# Patient Record
Sex: Female | Born: 1980 | Race: White | Hispanic: Yes | Marital: Single | Smoking: Former smoker
Health system: Southern US, Community
[De-identification: ages and names within clinical notes are randomized; demographics above are authoritative.]

## PROBLEM LIST (undated history)

## (undated) DIAGNOSIS — O039 Complete or unspecified spontaneous abortion without complication: Secondary | ICD-10-CM

## (undated) DIAGNOSIS — J45909 Unspecified asthma, uncomplicated: Secondary | ICD-10-CM

## (undated) HISTORY — PX: DILATION AND CURETTAGE OF UTERUS: SHX78

---

## 2008-02-27 ENCOUNTER — Emergency Department: Payer: Self-pay | Admitting: Emergency Medicine

## 2012-11-13 ENCOUNTER — Emergency Department: Payer: Self-pay | Admitting: Emergency Medicine

## 2013-03-05 ENCOUNTER — Emergency Department: Payer: Self-pay | Admitting: Emergency Medicine

## 2013-03-05 LAB — URINALYSIS, COMPLETE
Bilirubin,UR: NEGATIVE
Glucose,UR: NEGATIVE mg/dL (ref 0–75)
Leukocyte Esterase: NEGATIVE
Nitrite: NEGATIVE
Protein: 30
RBC,UR: 209 /HPF (ref 0–5)
Specific Gravity: 1.026 (ref 1.003–1.030)
Squamous Epithelial: 2

## 2013-03-05 LAB — CBC
HGB: 13.1 g/dL (ref 12.0–16.0)
MCH: 33.3 pg (ref 26.0–34.0)
MCHC: 36 g/dL (ref 32.0–36.0)
MCV: 93 fL (ref 80–100)
Platelet: 334 10*3/uL (ref 150–440)
RDW: 13.8 % (ref 11.5–14.5)
WBC: 7.8 10*3/uL (ref 3.6–11.0)

## 2013-03-05 LAB — HCG, QUANTITATIVE, PREGNANCY: Beta Hcg, Quant.: 119 m[IU]/mL — ABNORMAL HIGH

## 2013-03-05 LAB — WET PREP, GENITAL

## 2013-03-06 LAB — GC/CHLAMYDIA PROBE AMP

## 2013-03-09 ENCOUNTER — Ambulatory Visit: Payer: Self-pay | Admitting: Obstetrics and Gynecology

## 2013-03-09 LAB — COMPREHENSIVE METABOLIC PANEL
Albumin: 3 g/dL — ABNORMAL LOW (ref 3.4–5.0)
Anion Gap: 10 (ref 7–16)
BUN: 12 mg/dL (ref 7–18)
Bilirubin,Total: 0.5 mg/dL (ref 0.2–1.0)
Calcium, Total: 8 mg/dL — ABNORMAL LOW (ref 8.5–10.1)
Chloride: 108 mmol/L — ABNORMAL HIGH (ref 98–107)
Co2: 20 mmol/L — ABNORMAL LOW (ref 21–32)
Creatinine: 0.68 mg/dL (ref 0.60–1.30)
EGFR (African American): >60
EGFR (Non-African Amer.): >60
SGPT (ALT): 10 U/L — ABNORMAL LOW (ref 12–78)
Sodium: 138 mmol/L (ref 136–145)
Total Protein: 5.8 g/dL — ABNORMAL LOW (ref 6.4–8.2)

## 2013-03-09 LAB — CBC
HCT: 27.5 % — ABNORMAL LOW (ref 35.0–47.0)
HGB: 9.5 g/dL — ABNORMAL LOW (ref 12.0–16.0)
MCH: 32.3 pg (ref 26.0–34.0)
MCHC: 34.5 g/dL (ref 32.0–36.0)
RBC: 2.93 10*6/uL — ABNORMAL LOW (ref 3.80–5.20)
WBC: 22.9 10*3/uL — ABNORMAL HIGH (ref 3.6–11.0)

## 2013-03-09 LAB — APTT: Activated PTT: 26.5 secs (ref 23.6–35.9)

## 2013-03-10 LAB — PATHOLOGY REPORT

## 2013-03-14 LAB — CULTURE, BLOOD (SINGLE)

## 2014-03-25 DIAGNOSIS — J45909 Unspecified asthma, uncomplicated: Secondary | ICD-10-CM | POA: Insufficient documentation

## 2014-03-25 DIAGNOSIS — O99330 Smoking (tobacco) complicating pregnancy, unspecified trimester: Secondary | ICD-10-CM | POA: Insufficient documentation

## 2014-06-08 LAB — HM PAP SMEAR: HM PAP: NORMAL

## 2015-01-28 NOTE — Op Note (Signed)
PATIENT NAME:  Christine Bowers, Christine Bowers MR#:  [id number] DATE OF BIRTH:  September 01, 1981  DATE OF PROCEDURE:  03/09/2013  PREOPERATIVE DIAGNOSES:  1.  Hypotension.  2.  Acute vaginal bleeding.  3.  Possible incomplete abortion.  4.  Cannot rule out ectopic pregnancy.   POSTOPERATIVE DIAGNOSES:  1.  Hypotension.  2.  Acute vaginal bleeding.  3.  Incomplete abortion.  4.  Ectopic pregnancy ruled out.  5.  Pelvic adhesive disease.   OPERATIVE PROCEDURES:  1.  Suction D and C.  2.  Diagnostic laparoscopy.   SURGEON: Herold Harms, M.D.   FIRST ASSISTANT: None.   ANESTHESIA: General endotracheal.   INDICATIONS: The patient is a 34 year old, married, white female, gravida 3, para 2- 0-0-2, with uncertain last menstrual period, who presented through the Emergency Room with acute vaginal bleeding and hypotension; she is status post 4 L of IV fluids and 2 units of packed red blood cells with stabilization of vital signs. Quantitative hCG titer has dropped from 191 to 41; preoperative hemoglobin is 9.4 grams. Pelvic ultrasound today demonstrated fluid and clot within the pelvis. No adnexal mass was seen. There was no evidence of pregnancy within the uterine cavity. Previous ultrasounds 2 days ago demonstrated thickened endometrium 2.3 cm without evidence of adnexal mass.   FINDINGS AT SURGERY: Included an open cervical os with decidual-like tissue at the os. Suction D and C was performed with all tissue being removed from the endometrial cavity. Laparoscopy was performed and demonstrated a grossly normal pelvis with mild pelvic adhesions between the bowel, left adnexa and the left pelvic sidewall. No lysis was performed. The fallopian tubes and ovaries were grossly normal. The upper abdomen, including liver and gallbladder, were normal. There was no evidence of ectopic pregnancy within the pelvis.   DESCRIPTION OF PROCEDURE: The patient was brought to the operating room where she was placed in the supine  position and general endotracheal anesthesia was induced without difficulty. She was placed in the dorsal lithotomy position using the bumblebee stirrups. A ChloraPrep and Betadine abdominal, perineal, intravaginal prep and drape was performed in the standard fashion. A Foley catheter, which previously had been placed, was removed. The patient was placed in the dorsal lithotomy position. A weighted speculum was placed into the vagina and a single-tooth tenaculum was placed on the anterior lip of the cervix. Decidual-like tissue noted at the os was removed with ring forceps. The uterus sounded to 12 cm. A #10 suction curette was used with suction curettage to remove tissue consistent with POC. A serrated curette was also used and verified a good cry without significant residual tissue being left behind. A Hulka tenaculum was placed on the cervix. The D and C was  terminated and the patient was placed in low lithotomy position. A subumbilical vertical 5 mm incision was made. The Optiview laparoscopic trocar system was used to place a 5 mm port directly into the abdominal pelvic cavity without evidence of bowel or vascular injury. CO2 pneumoperitoneum was created. The above-noted findings were photo documented. The pelvis was irrigated with normal saline and the irrigant fluid was aspirated with a blunt tipped probe. Once satisfied with documentation of no evidence of ectopic pregnancy, the procedure was terminated with all instrumentation being removed from the abdomen. The pneumoperitoneum was released. The incisions were closed with Dermabond glue. The patient was then awakened, extubated, and taken to the recovery room in satisfactory condition.   ESTIMATED BLOOD LOSS: Minimal.   COMPLICATIONS: None.  All instruments, needle and sponge counts were verified as correct.   ____________________________ Prentice DockerMartin A. DeFrancesco, MD mad:aw D: 03/09/2013 07:03:18 ET T: 03/09/2013 09:21:03  ET JOB#: [id number]  cc: Daphine DeutscherMartin A. DeFrancesco, MD, <Dictator> [facility] Prentice DockerMARTIN A DEFRANCESCO MD ELECTRONICALLY SIGNED 03/17/2013 15:05

## 2015-02-15 NOTE — H&P (Signed)
L&D Evaluation:  History:  HPI 31 MWF [id number] ADMITTTED THROUGH ER WITH ACUTE VAGINAL BLEEDING, HYPOTENSION, AND NON VIABLE FIRST TRIMESTER PREGNANCY; QUANTITATIVE HCG DROPPING 191 TO 41; ACUTE BLEEDING OVER 48 HOURS CAUSED 4 GRAM DROP IN HGB; S/P 2 UNITS PRBC'S HGB NOW 9.4; VSS(PULSE 78, BP 100/70). U/S 2 DAYS AGO SHOWED NOTHING IN UTERUS BUT 2.3 CM HETEROGENOUS ENDOMETRIUM AND NOTHING IN ADNEXA. U/S TODAY NOTABLE FOR LARGE AMOUNT OF FLUID IN PELVIS, WITHOUT DISCRETE MASS, UTERUS STILL EMPTY. PT DID HAVE RLQ PAIN BEFORE ER ARRIVAL.   Patient's Medical History Asthma   Patient's Surgical History EAR TUVBES left ear CHILD   Medications ADVAIR, ZYRTEC, PROAIR   Allergies NSAIDS   Social History tobacco  6 CIGS/DAY   Family History Non-Contributory   ROS:  ROS PER HPI   Exam:  Vital Signs PULSE 78; BP100/70   General no apparent distress   Mental Status clear   Chest clear   Heart normal sinus rhythm   Abdomen FLAT, NONDISTENDED   Edema no edema   Pelvic DEFERRED TO OR   Skin dry   Lymph no lymphadenopathy   Impression:  Impression ACUTE VAGINAL BLEEDING WITH HYPOTENSION, S/P 2 UNIT PRBC TRANSFUSION AND 4 L NS IVF'S; CAN NOT R/O ECTOPIC, SAB; ELEVATESD WBC COUNT, S/P ZOSYN IV ANTIBIOTIC X 1 DOSE.   Plan:  Plan LAPAROSCOPY WITH REMOVAL OF POSSIBLE ECTOPIC, SUCTION D&C   Comments PRE OP COUNSELING:  PT HAS BEEN COUNSELED REGARDING PLANNED PROCEDURES. SHE IS ACCEPTING OF ALL RISKS INCLUDING BUT NOT LIMITED TOI BLEEDING/INFECTION/PELVIC ORGAN INJURY WITH NEED FOR REPAIR/ANESTHESIA RISKS/ETC. SHE UNDERSTANDS THAT FUTURE FERTILITY IS NOT GUARANTEED. ALL QUESTIONS ARE ANSWERED. CONSENT IS GIVEN.   Electronic Signatures: Raina Sole, Prentice DockerMartin A (MD)  (Signed 02-Jun-14 05:11)  Authored: L&D Evaluation   Last Updated: 02-Jun-14 05:11 by Estefana Taylor, Prentice DockerMartin A (MD)

## 2015-07-13 ENCOUNTER — Emergency Department: Payer: Medicaid Other

## 2015-07-13 ENCOUNTER — Encounter: Payer: Self-pay | Admitting: Emergency Medicine

## 2015-07-13 ENCOUNTER — Emergency Department
Admission: EM | Admit: 2015-07-13 | Discharge: 2015-07-13 | Disposition: A | Discharge status: Home / Self Care | Payer: Medicaid Other | Attending: Emergency Medicine | Admitting: Emergency Medicine

## 2015-07-13 DIAGNOSIS — Z3A01 Less than 8 weeks gestation of pregnancy: Secondary | ICD-10-CM | POA: Insufficient documentation

## 2015-07-13 DIAGNOSIS — O99331 Smoking (tobacco) complicating pregnancy, first trimester: Secondary | ICD-10-CM | POA: Insufficient documentation

## 2015-07-13 DIAGNOSIS — O039 Complete or unspecified spontaneous abortion without complication: Secondary | ICD-10-CM

## 2015-07-13 DIAGNOSIS — F17200 Nicotine dependence, unspecified, uncomplicated: Secondary | ICD-10-CM | POA: Diagnosis not present

## 2015-07-13 DIAGNOSIS — O209 Hemorrhage in early pregnancy, unspecified: Secondary | ICD-10-CM | POA: Diagnosis present

## 2015-07-13 HISTORY — DX: Unspecified asthma, uncomplicated: J45.909

## 2015-07-13 LAB — URINALYSIS COMPLETE WITH MICROSCOPIC (ARMC ONLY)
BILIRUBIN URINE: NEGATIVE
Bacteria, UA: NONE SEEN
GLUCOSE, UA: NEGATIVE mg/dL
Ketones, ur: NEGATIVE mg/dL
Leukocytes, UA: NEGATIVE
Nitrite: NEGATIVE
Protein, ur: 30 mg/dL — AB
Specific Gravity, Urine: 1.004 — ABNORMAL LOW (ref 1.005–1.030)
pH: 6 (ref 5.0–8.0)

## 2015-07-13 LAB — CBC
HCT: 37.2 % (ref 35.0–47.0)
Hemoglobin: 12.8 g/dL (ref 12.0–16.0)
MCH: 32 pg (ref 26.0–34.0)
MCHC: 34.3 g/dL (ref 32.0–36.0)
MCV: 93.2 fL (ref 80.0–100.0)
PLATELETS: 253 10*3/uL (ref 150–440)
RBC: 3.99 MIL/uL (ref 3.80–5.20)
RDW: 12.8 % (ref 11.5–14.5)
WBC: 18.7 10*3/uL — ABNORMAL HIGH (ref 3.6–11.0)

## 2015-07-13 LAB — HCG, QUANTITATIVE, PREGNANCY: hCG, Beta Chain, Quant, S: 45986 m[IU]/mL — ABNORMAL HIGH (ref <5)

## 2015-07-13 LAB — ABO/RH: ABO/RH(D): O POS

## 2015-07-13 MED ORDER — SODIUM CHLORIDE 0.9 % IV BOLUS (SEPSIS)
1000.0000 mL | Freq: Once | INTRAVENOUS | Status: AC
  Administered 2015-07-13: 1000 mL via INTRAVENOUS

## 2015-07-13 NOTE — ED Notes (Signed)
Pt started having cramping and vag bleeding at home, pt is pregnant positive pregnancy test at home 2 weeks ago

## 2015-07-13 NOTE — ED Notes (Signed)
Pt awaiting ultrasound

## 2015-07-13 NOTE — ED Notes (Signed)
ED Medic Sydnee Cabal logged POC into pathology at 0840.

## 2015-07-13 NOTE — ED Notes (Signed)
Pt ambulatory to triage, tearful; states she is pregnant but unsure how far along; c/o vag bleeding and cramping tonight; pt ambulates to restroom to void; mod amount vag bleeding noted; pt st urge to push and passes small fetus (approx 3" in length) and what appears to be placenta; pt assisted onto stretcher and taken to room 3; charge nurse notified and POC placed in formalin container and taken to room--care nurse notified

## 2015-07-13 NOTE — ED Provider Notes (Signed)
[facility] Emergency Department Provider Note  ____________________________________________  Time seen: Approximately 5:03 AM  I have reviewed the triage vital signs and the nursing notes.   HISTORY  Chief Complaint Vaginal Bleeding    HPI Christine Bowers is a 34 y.o. female who presents to the ED from home with the chief complaint of first trimester vaginal bleeding. Patient is a G6 P3 Ab2 who is unsure of her dates. Positive home pregnancy test 2 weeks ago. Patient awoke approximately 3:30am with pelvic cramping and vaginal bleeding. Patient use the restroom in the ED lobby and passed clots and small fetus. Denies feeling lightheaded, chest pain, shortness of breath, nausea, vomiting, diarrhea. States pelvic cramping and vaginal bleeding have improved since she passed POCs. Denies recent trauma.   Past Medical History  Diagnosis Date  . Asthma     There are no active problems to display for this patient.   No past surgical history on file.  No current outpatient prescriptions on file.  Allergies Aspirin and Ibuprofen  No family history on file.  Social History Social History  Substance Use Topics  . Smoking status: Current Every Day Smoker  . Smokeless tobacco: None  . Alcohol Use: Yes    Review of Systems Constitutional: No fever/chills Eyes: No visual changes. ENT: No sore throat. Cardiovascular: Denies chest pain. Respiratory: Denies shortness of breath. Gastrointestinal: Positive for pelvic pain. No nausea, no vomiting.  No diarrhea.  No constipation. Genitourinary: Positive for vaginal bleeding. Negative for dysuria. Musculoskeletal: Negative for back pain. Skin: Negative for rash. Neurological: Negative for headaches, focal weakness or numbness.  10-point ROS otherwise negative.  ____________________________________________   PHYSICAL EXAM:  VITAL SIGNS: ED Triage Vitals  Enc Vitals Group     BP 07/13/15 0445 115/70  mmHg     Pulse Rate 07/13/15 0445 78     Resp 07/13/15 0445 18     Temp 07/13/15 0445 98.1 F (36.7 C)     Temp Source 07/13/15 0445 Oral     SpO2 07/13/15 0445 97 %     Weight 07/13/15 0445 165 lb (74.844 kg)     Height 07/13/15 0445  (1.651 m)     Head Cir --      Peak Flow --      Pain Score 07/13/15 0445 2     Pain Loc --      Pain Edu? --      Excl. in GC? --     Constitutional: Alert and oriented. Tearful and in mild acute distress. Eyes: Conjunctivae are normal. PERRL. EOMI. Head: Atraumatic. Nose: No congestion/rhinnorhea. Mouth/Throat: Mucous membranes are moist.  Oropharynx non-erythematous. Neck: No stridor.   Cardiovascular: Normal rate, regular rhythm. Grossly normal heart sounds.  Good peripheral circulation. Respiratory: Normal respiratory effort.  No retractions. Lungs CTAB. Gastrointestinal: Soft and nontender. No distention. No abdominal bruits. No CVA tenderness. Musculoskeletal: No lower extremity tenderness nor edema.  No joint effusions. Neurologic:  Normal speech and language. No gross focal neurologic deficits are appreciated.  Skin:  Skin is warm, dry and intact. No rash noted. Psychiatric: Mood and affect are normal. Speech and behavior are normal.  ____________________________________________   LABS (all labs ordered are listed, but only abnormal results are displayed)  Labs Reviewed  HCG, QUANTITATIVE, PREGNANCY - Abnormal; Notable for the following:    hCG, Beta Chain, Quant, [place] [address] (*)    All other components within normal limits  CBC - Abnormal; Notable for the following:  WBC 18.7 (*)    All other components within normal limits  URINALYSIS COMPLETEWITH MICROSCOPIC ([facility] ONLY) - Abnormal; Notable for the following:    Color, Urine YELLOW (*)    APPearance CLEAR (*)    Specific Gravity, Urine 1.004 (*)    Hgb urine dipstick 3+ (*)    Protein, ur 30 (*)    Squamous Epithelial / LPF 0-5 (*)    All other components within normal  limits  ABO/RH   ____________________________________________  EKG  None ____________________________________________  RADIOLOGY  US OB Transvaginal interpreted per Dr. Cherly Hensen: No intrauterine gestational sac seen. No evidence for ectopic pregnancy. Small amount of blood noted within the endometrial canal. No evidence of retained products of conception. Per clinical correlation, the patient passed products of conception while in the ER. ____________________________________________   PROCEDURES  Procedure(s) performed:   Pelvic exam: External exam within normal limits without rash, vesicles or lesions. Speculum exam reveals mild vaginal bleeding; closed cervical os. Bimanual exam within normal limits.    Critical Care performed: No  ____________________________________________   INITIAL IMPRESSION / ASSESSMENT AND PLAN / ED COURSE  Pertinent labs & imaging results that were available during my care of the patient were reviewed by me and considered in my medical decision making (see chart for details).  34 year old female with first trimester vaginal bleeding and pelvic pain who passed products of conception in ED lobby. I visualized the specimen myself; fetus appears intact along with placenta. Will check lab work, blood type, obtain pelvic ultrasound to evaluate for retained POC's.  ----------------------------------------- 7:06 AM on 07/13/2015 -----------------------------------------  Patient resting in no acute distress. Tells me she is certain her blood type is O+. Updated her of ultrasound results. Strict return precautions given. Patient verbalizes understanding and agrees with plan of care.  __________________________________________   FINAL CLINICAL IMPRESSION(S) / ED DIAGNOSES  Final diagnoses:  Spontaneous miscarriage      Irean Hong, MD 07/13/15 [phone]

## 2015-07-13 NOTE — ED Notes (Signed)
Pt alert & oriented; vitals updated. Pt asks about discharge. She denies pain and states that she feels ready to go home.

## 2015-07-13 NOTE — Discharge Instructions (Signed)
1. Please follow-up with the specialist to recheck your pregnancy levels in one week. 2. Return to the ER for worsening symptoms, persistent vomiting, fainting or other concerns.  Miscarriage A miscarriage is the sudden loss of an unborn baby (fetus) before the 20th week of pregnancy. Most miscarriages happen in the first 3 months of pregnancy. Sometimes, it happens before a woman even knows she is pregnant. A miscarriage is also called a "spontaneous miscarriage" or "early pregnancy loss." Having a miscarriage can be an emotional experience. Talk with your caregiver about any questions you may have about miscarrying, the grieving process, and your future pregnancy plans. CAUSES   Problems with the fetal chromosomes that make it impossible for the baby to develop normally. Problems with the baby's genes or chromosomes are most often the result of errors that occur, by chance, as the embryo divides and grows. The problems are not inherited from the parents.  Infection of the cervix or uterus.   Hormone problems.   Problems with the cervix, such as having an incompetent cervix. This is when the tissue in the cervix is not strong enough to hold the pregnancy.   Problems with the uterus, such as an abnormally shaped uterus, uterine fibroids, or congenital abnormalities.   Certain medical conditions.   Smoking, drinking alcohol, or taking illegal drugs.   Trauma.  Often, the cause of a miscarriage is unknown.  SYMPTOMS   Vaginal bleeding or spotting, with or without cramps or pain.  Pain or cramping in the abdomen or lower back.  Passing fluid, tissue, or blood clots from the vagina. DIAGNOSIS  Your caregiver will perform a physical exam. You may also have an ultrasound to confirm the miscarriage. Blood or urine tests may also be ordered. TREATMENT   Sometimes, treatment is not necessary if you naturally pass all the fetal tissue that was in the uterus. If some of the fetus or  placenta remains in the body (incomplete miscarriage), tissue left behind may become infected and must be removed. Usually, a dilation and curettage (D and C) procedure is performed. During a D and C procedure, the cervix is widened (dilated) and any remaining fetal or placental tissue is gently removed from the uterus.  Antibiotic medicines are prescribed if there is an infection. Other medicines may be given to reduce the size of the uterus (contract) if there is a lot of bleeding.  If you have Rh negative blood and your baby was Rh positive, you will need a Rh immunoglobulin shot. This shot will protect any future baby from having Rh blood problems in future pregnancies. HOME CARE INSTRUCTIONS   Your caregiver may order bed rest or may allow you to continue light activity. Resume activity as directed by your caregiver.  Have someone help with home and family responsibilities during this time.   Keep track of the number of sanitary pads you use each day and how soaked (saturated) they are. Write down this information.   Do not use tampons. Do not douche or have sexual intercourse until approved by your caregiver.   Only take over-the-counter or prescription medicines for pain or discomfort as directed by your caregiver.   Do not take aspirin. Aspirin can cause bleeding.   Keep all follow-up appointments with your caregiver.   If you or your partner have problems with grieving, talk to your caregiver or seek counseling to help cope with the pregnancy loss. Allow enough time to grieve before trying to get pregnant again.  SEEK  IMMEDIATE MEDICAL CARE IF:   You have severe cramps or pain in your back or abdomen.  You have a fever.  You pass large blood clots (walnut-sized or larger) ortissue from your vagina. Save any tissue for your caregiver to inspect.   Your bleeding increases.   You have a thick, bad-smelling vaginal discharge.  You become lightheaded, weak, or you  faint.   You have chills.  MAKE SURE YOU:  Understand these instructions.  Will watch your condition.  Will get help right away if you are not doing well or get worse.   This information is not intended to replace advice given to you by your health care provider. Make sure you discuss any questions you have with your health care provider.   Document Released: 03/20/2001 Document Revised: 01/19/2013 Document Reviewed: 11/13/2011 Elsevier Interactive Patient Education [employer]

## 2015-07-13 NOTE — ED Notes (Signed)
Pt discharged home after verbalizing understanding of discharge instructions; nad noted. 

## 2015-07-13 NOTE — ED Notes (Signed)
Pt state she woke up this am with lower abd cramping and vaginal bleeding.  Pt is pregnant but unsure of gestation, states had positive pregnancy test 2 weeks ago and has irregular periods.  Pt states while in triage she passed what looks like products of conception.  States after passing poc cramping and bleeding has improved.  Pt denies any urinary symptoms or vaginal discharge.

## 2015-07-13 NOTE — ED Notes (Signed)
Pelvic done by md no specimens to lab

## 2015-07-13 NOTE — ED Notes (Signed)
Patient transported to Ultrasound 

## 2015-07-14 LAB — SURGICAL PATHOLOGY

## 2015-08-11 ENCOUNTER — Emergency Department
Admission: EM | Admit: 2015-08-11 | Discharge: 2015-08-11 | Disposition: A | Discharge status: Home / Self Care | Payer: Medicaid Other | Attending: Emergency Medicine | Admitting: Emergency Medicine

## 2015-08-11 ENCOUNTER — Encounter: Payer: Self-pay | Admitting: Emergency Medicine

## 2015-08-11 DIAGNOSIS — Z720 Tobacco use: Secondary | ICD-10-CM | POA: Diagnosis not present

## 2015-08-11 DIAGNOSIS — J4520 Mild intermittent asthma, uncomplicated: Secondary | ICD-10-CM | POA: Insufficient documentation

## 2015-08-11 DIAGNOSIS — J45909 Unspecified asthma, uncomplicated: Secondary | ICD-10-CM | POA: Diagnosis present

## 2015-08-11 MED ORDER — ALBUTEROL SULFATE HFA 108 (90 BASE) MCG/ACT IN AERS: 2.0000 | INHALATION_SPRAY | Freq: Four times a day (QID) | RESPIRATORY_TRACT | Status: DC | PRN

## 2015-08-11 MED ORDER — IPRATROPIUM-ALBUTEROL 0.5-2.5 (3) MG/3ML IN SOLN
3.0000 mL | Freq: Once | RESPIRATORY_TRACT | Status: AC
  Administered 2015-08-11: 3 mL via RESPIRATORY_TRACT
  Filled 2015-08-11: qty 3

## 2015-08-11 NOTE — ED Provider Notes (Signed)
[facility] Emergency Department Provider Note  ____________________________________________  Time seen: Approximately 8:14 AM  I have reviewed the triage vital signs and the nursing notes.   HISTORY  Chief Complaint Asthma   HPI Christine Bowers is a 34 y.o. female is here with complaint of asthma flare. Patient states it started to get worse yesterday. She has not had an inhaler due to insurance reasons for over a year. Patient states her wheezing is worse today.Patient states she also has difficulty with allergies this time a year. She has not used a nebulizer in many years.   Past Medical History  Diagnosis Date  . Asthma     There are no active problems to display for this patient.   History reviewed. No pertinent past surgical history.  Current Outpatient Rx  Name  Route  Sig  Dispense  Refill  . albuterol (PROVENTIL HFA;VENTOLIN HFA) 108 (90 BASE) MCG/ACT inhaler   Inhalation   Inhale 2 puffs into the lungs every 6 (six) hours as needed for wheezing or shortness of breath.   1 Inhaler   2     Allergies Aspirin and Ibuprofen  History reviewed. No pertinent family history.  Social History Social History  Substance Use Topics  . Smoking status: Current Every Day Smoker  . Smokeless tobacco: None  . Alcohol Use: Yes    Review of Systems Constitutional: No fever/chills Cardiovascular: Denies chest pain. Respiratory: Denies shortness of breath. Positive wheezing Gastrointestinal:   No nausea, no vomiting.  Musculoskeletal: Negative for back pain. Skin: Negative for rash. Neurological: Negative for headaches, focal weakness or numbness.  10-point ROS otherwise negative.  ____________________________________________   PHYSICAL EXAM:  VITAL SIGNS: ED Triage Vitals  Enc Vitals Group     BP 08/11/15 0803 118/51 mmHg     Pulse Rate 08/11/15 0803 84     Resp 08/11/15 0803 18     Temp 08/11/15 0803 97.6 F (36.4 C)     Temp  Source 08/11/15 0803 Oral     SpO2 08/11/15 0803 97 %     Weight 08/11/15 0803 160 lb (72.576 kg)     Height 08/11/15 0803 5\' 5"  (1.651 m)     Head Cir --      Peak Flow --      Pain Score --      Pain Loc --      Pain Edu? --      Excl. in GC? --     Constitutional: Alert and oriented. Well appearing and in no acute distress. Eyes: Conjunctivae are normal. PERRL. EOMI. Head: Atraumatic. Nose: No congestion/rhinnorhea.   EACs and TMs are clear bilaterally. Mouth/Throat: Mucous membranes are moist.  Oropharynx non-erythematous. Neck: No stridor.  Supple Hematological/Lymphatic/Immunilogical: No cervical lymphadenopathy. Cardiovascular: Normal rate, regular rhythm. Grossly normal heart sounds.  Good peripheral circulation. Respiratory: Normal respiratory effort.  No retractions. Lungs bilateral inspiratory and expiratory wheezes faintly throughout. Patient is no acute distress and speaking in full sentences. Gastrointestinal: Soft and nontender. No distention.  Musculoskeletal: No lower extremity tenderness nor edema.  No joint effusions. Neurologic:  Normal speech and language. No gross focal neurologic deficits are appreciated. No gait instability. Skin:  Skin is warm, dry and intact. No rash noted. Psychiatric: Mood and affect are normal. Speech and behavior are normal.  ____________________________________________   LABS (all labs ordered are listed, but only abnormal results are displayed)  Labs Reviewed - No data to display  PROCEDURES  Procedure(s) performed: None  Critical Care performed: No  ____________________________________________   INITIAL IMPRESSION / ASSESSMENT AND PLAN / ED COURSE  Pertinent labs & imaging results that were available during my care of the patient were reviewed by me and considered in my medical decision making (see chart for details).  Patient received DuoNeb treatment while in the emergency room and cleared completely. Patient was  given a prescription for albuterol inhaler. All patient was here she found out that her Medicare was reinstated therefore she couldn't get her inhaler. ____________________________________________   FINAL CLINICAL IMPRESSION(S) / ED DIAGNOSES  Final diagnoses:  Asthma, mild intermittent, uncomplicated      Tommi Rumps, PA-C 08/11/15 1252  Phineas Semen, MD 08/11/15 1453

## 2015-08-11 NOTE — Discharge Instructions (Signed)
Asthma, Adult Asthma is a condition of the lungs in which the airways tighten and narrow. Asthma can make it hard to breathe. Asthma cannot be cured, but medicine and lifestyle changes can help control it. Asthma may be started (triggered) by:  Animal skin flakes (dander).  Dust.  Cockroaches.  Pollen.  Mold.  Smoke.  Cleaning products.  Hair sprays or aerosol sprays.  Paint fumes or strong smells.  Cold air, weather changes, and winds.  Crying or laughing hard.  Stress.  Certain medicines or drugs.  Foods, such as dried fruit, potato chips, and sparkling grape juice.  Infections or conditions (colds, flu).  Exercise.  Certain medical conditions or diseases.  Exercise or tiring activities. HOME CARE   Take medicine as told by your doctor.  Use a peak flow meter as told by your doctor. A peak flow meter is a tool that measures how well the lungs are working.  Record and keep track of the peak flow meter's readings.  Understand and use the asthma action plan. An asthma action plan is a written plan for taking care of your asthma and treating your attacks.  To help prevent asthma attacks:  Do not smoke. Stay away from secondhand smoke.  Change your heating and air conditioning filter often.  Limit your use of fireplaces and wood stoves.  Get rid of pests (such as roaches and mice) and their droppings.  Throw away plants if you see mold on them.  Clean your floors. Dust regularly. Use cleaning products that do not smell.  Have someone vacuum when you are not home. Use a vacuum cleaner with a HEPA filter if possible.  Replace carpet with wood, tile, or vinyl flooring. Carpet can trap animal skin flakes and dust.  Use allergy-proof pillows, mattress covers, and box spring covers.  Wash bed sheets and blankets every week in hot water and dry them in a dryer.  Use blankets that are made of polyester or cotton.  Clean bathrooms and kitchens with bleach.  If possible, have someone repaint the walls in these rooms with mold-resistant paint. Keep out of the rooms that are being cleaned and painted.  Wash hands often. GET HELP IF:  You have make a whistling sound when breaking (wheeze), have shortness of breath, or have a cough even if taking medicine to prevent attacks.  The colored mucus you cough up (sputum) is thicker than usual.  The colored mucus you cough up changes from clear or white to yellow, green, gray, or bloody.  You have problems from the medicine you are taking such as:  A rash.  Itching.  Swelling.  Trouble breathing.  You need reliever medicines more than 2-3 times a week.  Your peak flow measurement is still at 50-79% of your personal best after following the action plan for 1 hour.  You have a fever. GET HELP RIGHT AWAY IF:   You seem to be worse and are not responding to medicine during an asthma attack.  You are short of breath even at rest.  You get short of breath when doing very little activity.  You have trouble eating, drinking, or talking.  You have chest pain.  You have a fast heartbeat.  Your lips or fingernails start to turn blue.  You are light-headed, dizzy, or faint.  Your peak flow is less than 50% of your personal best.   This information is not intended to replace advice given to you by your health care provider. Make sure  you discuss any questions you have with your health care provider.   Document Released: 03/12/2008 Document Revised: 06/15/2015 Document Reviewed: 04/23/2013 Elsevier Interactive Patient Education [employer]2016 [employer]   Follow-up with your doctor if any continued problems. A prescription for albuterol was given daily to use as needed for asthma. Obtain any over-the-counter medication for congestion and should she continue to have nasal congestion or cold symptoms.

## 2015-08-11 NOTE — ED Notes (Signed)
Pt c/o asthma flare.  Started yesterday but worse today.  Rescue inhaler is out and pt does not have insurance so has not been able to use medication. Wheezing heard with ausculation. No respiratory distress in triage.

## 2016-05-02 ENCOUNTER — Inpatient Hospital Stay
Admission: EM | Admit: 2016-05-02 | Discharge: 2016-05-05 | DRG: 770 | Disposition: A | Discharge status: Home / Self Care | Payer: BLUE CROSS/BLUE SHIELD | Attending: Obstetrics and Gynecology | Admitting: Obstetrics and Gynecology

## 2016-05-02 ENCOUNTER — Emergency Department: Payer: BLUE CROSS/BLUE SHIELD

## 2016-05-02 ENCOUNTER — Encounter: Payer: Self-pay | Admitting: Emergency Medicine

## 2016-05-02 DIAGNOSIS — O209 Hemorrhage in early pregnancy, unspecified: Secondary | ICD-10-CM

## 2016-05-02 DIAGNOSIS — O468X1 Other antepartum hemorrhage, first trimester: Secondary | ICD-10-CM

## 2016-05-02 DIAGNOSIS — N719 Inflammatory disease of uterus, unspecified: Secondary | ICD-10-CM | POA: Diagnosis present

## 2016-05-02 DIAGNOSIS — O0333 Metabolic disorder following incomplete spontaneous abortion: Secondary | ICD-10-CM | POA: Diagnosis present

## 2016-05-02 DIAGNOSIS — D649 Anemia, unspecified: Secondary | ICD-10-CM | POA: Diagnosis present

## 2016-05-02 DIAGNOSIS — B9689 Other specified bacterial agents as the cause of diseases classified elsewhere: Secondary | ICD-10-CM

## 2016-05-02 DIAGNOSIS — O3680X0 Pregnancy with inconclusive fetal viability, not applicable or unspecified: Secondary | ICD-10-CM

## 2016-05-02 DIAGNOSIS — A4151 Sepsis due to Escherichia coli [E. coli]: Secondary | ICD-10-CM | POA: Diagnosis present

## 2016-05-02 DIAGNOSIS — O2341 Unspecified infection of urinary tract in pregnancy, first trimester: Secondary | ICD-10-CM

## 2016-05-02 DIAGNOSIS — F17200 Nicotine dependence, unspecified, uncomplicated: Secondary | ICD-10-CM | POA: Diagnosis present

## 2016-05-02 DIAGNOSIS — D696 Thrombocytopenia, unspecified: Secondary | ICD-10-CM | POA: Diagnosis present

## 2016-05-02 DIAGNOSIS — E876 Hypokalemia: Secondary | ICD-10-CM | POA: Diagnosis present

## 2016-05-02 DIAGNOSIS — O0337 Sepsis following incomplete spontaneous abortion: Principal | ICD-10-CM | POA: Diagnosis present

## 2016-05-02 DIAGNOSIS — J45909 Unspecified asthma, uncomplicated: Secondary | ICD-10-CM | POA: Diagnosis present

## 2016-05-02 DIAGNOSIS — O418X10 Other specified disorders of amniotic fluid and membranes, first trimester, not applicable or unspecified: Secondary | ICD-10-CM

## 2016-05-02 DIAGNOSIS — N760 Acute vaginitis: Secondary | ICD-10-CM

## 2016-05-02 HISTORY — DX: Complete or unspecified spontaneous abortion without complication: O03.9

## 2016-05-02 LAB — URINALYSIS COMPLETE WITH MICROSCOPIC (ARMC ONLY)
BILIRUBIN URINE: NEGATIVE
Glucose, UA: NEGATIVE mg/dL
NITRITE: NEGATIVE
PH: 5 (ref 5.0–8.0)
Protein, ur: 100 mg/dL — AB
SPECIFIC GRAVITY, URINE: 1.029 (ref 1.005–1.030)

## 2016-05-02 LAB — BASIC METABOLIC PANEL
ANION GAP: 11 (ref 5–15)
BUN: 11 mg/dL (ref 6–20)
CALCIUM: 9.3 mg/dL (ref 8.9–10.3)
CO2: 21 mmol/L — AB (ref 22–32)
CREATININE: 0.59 mg/dL (ref 0.44–1.00)
Chloride: 104 mmol/L (ref 101–111)
Glucose, Bld: 108 mg/dL — ABNORMAL HIGH (ref 65–99)
Potassium: 2.8 mmol/L — ABNORMAL LOW (ref 3.5–5.1)
SODIUM: 136 mmol/L (ref 135–145)

## 2016-05-02 LAB — CBC
HCT: 41.9 % (ref 35.0–47.0)
HEMOGLOBIN: 14.5 g/dL (ref 12.0–16.0)
MCH: 32.3 pg (ref 26.0–34.0)
MCHC: 34.6 g/dL (ref 32.0–36.0)
MCV: 93.4 fL (ref 80.0–100.0)
PLATELETS: 175 10*3/uL (ref 150–440)
RBC: 4.49 MIL/uL (ref 3.80–5.20)
RDW: 13.8 % (ref 11.5–14.5)
WBC: 12.9 10*3/uL — AB (ref 3.6–11.0)

## 2016-05-02 LAB — CHLAMYDIA/NGC RT PCR (ARMC ONLY)
Chlamydia Tr: NOT DETECTED
N gonorrhoeae: NOT DETECTED

## 2016-05-02 LAB — WET PREP, GENITAL
SPERM: NONE SEEN
TRICH WET PREP: NONE SEEN
Yeast Wet Prep HPF POC: NONE SEEN

## 2016-05-02 LAB — ABO/RH: ABO/RH(D): O POS

## 2016-05-02 LAB — POCT PREGNANCY, URINE: Preg Test, Ur: POSITIVE — AB

## 2016-05-02 LAB — GLUCOSE, CAPILLARY: GLUCOSE-CAPILLARY: 86 mg/dL (ref 65–99)

## 2016-05-02 LAB — HCG, QUANTITATIVE, PREGNANCY: hCG, Beta Chain, Quant, S: 7179 m[IU]/mL — ABNORMAL HIGH (ref <5)

## 2016-05-02 MED ORDER — POTASSIUM CHLORIDE CRYS ER 20 MEQ PO TBCR
40.0000 meq | EXTENDED_RELEASE_TABLET | Freq: Once | ORAL | Status: AC
  Administered 2016-05-03: 40 meq via ORAL
  Filled 2016-05-02: qty 2

## 2016-05-02 MED ORDER — SODIUM CHLORIDE 0.9 % IV BOLUS (SEPSIS)
1000.0000 mL | Freq: Once | INTRAVENOUS | Status: AC
  Administered 2016-05-02: 1000 mL via INTRAVENOUS

## 2016-05-02 MED ORDER — CEPHALEXIN 500 MG PO CAPS
500.0000 mg | ORAL_CAPSULE | Freq: Once | ORAL | Status: AC
  Administered 2016-05-03: 500 mg via ORAL
  Filled 2016-05-02: qty 1

## 2016-05-02 MED ORDER — ONDANSETRON HCL 4 MG PO TABS: ORAL_TABLET | ORAL | 0 refills | Status: DC

## 2016-05-02 MED ORDER — METRONIDAZOLE 500 MG PO TABS
500.0000 mg | ORAL_TABLET | Freq: Once | ORAL | Status: AC
  Administered 2016-05-03: 500 mg via ORAL
  Filled 2016-05-02: qty 1

## 2016-05-02 MED ORDER — METRONIDAZOLE 500 MG PO TABS: 500.0000 mg | ORAL_TABLET | Freq: Two times a day (BID) | ORAL | 0 refills | Status: DC

## 2016-05-02 MED ORDER — CEPHALEXIN 500 MG PO CAPS: 500.0000 mg | ORAL_CAPSULE | Freq: Three times a day (TID) | ORAL | 0 refills | Status: DC

## 2016-05-02 MED ORDER — POTASSIUM CHLORIDE CRYS ER 20 MEQ PO TBCR: 20.0000 meq | EXTENDED_RELEASE_TABLET | Freq: Every day | ORAL | 0 refills | Status: DC

## 2016-05-02 NOTE — ED Notes (Signed)
Pt ambulatory to restroom with Allayne Stack, EDT assistance.

## 2016-05-02 NOTE — ED Provider Notes (Addendum)
[facility] Emergency Department Provider Note  ____________________________________________   First MD Initiated Contact with Patient 05/02/16 1935     (approximate)  I have reviewed the triage vital signs and the nursing notes.   HISTORY  Chief Complaint Vaginal Bleeding    HPI Christine Bowers is a 35 y.o. female G5 P3 Ab1 with a new diagnosis of pregnancy here in the emergency department who presents for evaluation of abdominal pain and vaginal bleeding.  She thought she might be pregnant because she has missed her period for the last 2 months.  Not taken home pregnancy test.  She reports that yesterday she developed acute onset of mild to severe lower abdominal cramping and occasionally sharp pain accompanied with nausea but no vomiting.  She reports that the pain is actually better now than it was before but that it can become severe at times.  She started having intermittent vaginal spotting that has increased somewhat in the amount of blood such that she has changed 4-5 pads since yesterday.  When she was triaged she was found heart rate in the 140s.  I identified her in the waiting room and brought her back to the exam room and at that point she already had a peripheral IV and was already receiving a fluid bolus.  She denies fever/chills, chest pain, shortness of breath, dysuria.   Past Medical History:  Diagnosis Date  . Asthma   . Miscarriage     There are no active problems to display for this patient.   Past Surgical History:  Procedure Laterality Date  . DILATION AND CURETTAGE OF UTERUS      Prior to Admission medications   Medication Sig Start Date End Date Taking? Authorizing Provider  albuterol (PROVENTIL HFA;VENTOLIN HFA) 108 (90 BASE) MCG/ACT inhaler Inhale 2 puffs into the lungs every 6 (six) hours as needed for wheezing or shortness of breath. 08/11/15   Tommi Rumps, PA-C  cephALEXin (KEFLEX) 500 MG capsule Take 1 capsule (500  mg total) by mouth 3 (three) times daily. 05/02/16   Loleta Rose, MD  metroNIDAZOLE (FLAGYL) 500 MG tablet Take 1 tablet (500 mg total) by mouth 2 (two) times daily. 05/02/16   Loleta Rose, MD  ondansetron [facility]) 4 MG tablet Take 1-2 tabs by mouth every 8 hours as needed for nausea/vomiting 05/02/16   Loleta Rose, MD  potassium chloride SA (KLOR-CON M20) 20 MEQ tablet Take 1 tablet (20 mEq total) by mouth daily. 05/02/16   Loleta Rose, MD    Allergies Aspirin and Ibuprofen  No family history on file.  Social History Social History  Substance Use Topics  . Smoking status: Current Every Day Smoker  . Smokeless tobacco: Not on file  . Alcohol use Yes    Review of Systems Constitutional: No fever/chills Eyes: No visual changes. ENT: No sore throat. Cardiovascular: Denies chest pain. Respiratory: Denies shortness of breath. Gastrointestinal: + Lower abdominal pain with nausea but no vomiting Genitourinary: Vaginal bleeding since yesterday , missed period for 2 months.  Negative for dysuria. Musculoskeletal: Negative for back pain. Skin: Negative for rash. Neurological: Negative for headaches, focal weakness or numbness.  10-point ROS otherwise negative.  ____________________________________________   PHYSICAL EXAM:  VITAL SIGNS: ED Triage Vitals  Enc Vitals Group     BP 05/02/16 1852 108/68     Pulse Rate 05/02/16 1852 (!) 142     Resp 05/02/16 1852 18     Temp 05/02/16 1852 98.7 F (37.1 C)  Temp Source 05/02/16 1852 Oral     SpO2 05/02/16 1852 98 %     Weight 05/02/16 1852 160 lb (72.6 kg)     Height 05/02/16 1852 5\' 5"  (1.651 m)     Head Circumference --      Peak Flow --      Pain Score 05/02/16 1946 4     Pain Loc --      Pain Edu? --      Excl. in GC? --     Constitutional: Alert and oriented. Well appearing and in no acute distress. Eyes: Conjunctivae are normal. PERRL. EOMI. Head: Atraumatic. Nose: No congestion/rhinnorhea. Mouth/Throat: Mucous  membranes are moist.  Oropharynx non-erythematous. Neck: No stridor.  No meningeal signs.   Cardiovascular: Normal rate, regular rhythm. Good peripheral circulation. Grossly normal heart sounds.   Respiratory: Normal respiratory effort.  No retractions. Lungs CTAB. Gastrointestinal: Soft With moderate diffuse abdominal tenderness throughout.  No distention. Genitourinary: Normal external exam.  Copious amount of yellowish gray discharge in the vaginal vault.  Cervix is very difficult to evaluate as it is significantly retroverted.  I am not able to specifically visualize the cervical os but there is no blood in the vault at this time.  On bimanual exam the cervix feels closed and there is no cervical motion tenderness nor adnexal tenderness. Musculoskeletal: No lower extremity tenderness nor edema. No gross deformities of extremities. Neurologic:  Normal speech and language. No gross focal neurologic deficits are appreciated.  Skin:  Skin is warm, dry and intact. No rash noted. Psychiatric: Mood and affect are normal. Speech and behavior are normal.  ____________________________________________   LABS (all labs ordered are listed, but only abnormal results are displayed)  Labs Reviewed  WET PREP, GENITAL - Abnormal; Notable for the following:       Result Value   Clue Cells Wet Prep HPF POC PRESENT (*)    WBC, Wet Prep HPF POC MANY (*)    All other components within normal limits  BASIC METABOLIC PANEL - Abnormal; Notable for the following:    Potassium 2.8 (*)    CO2 21 (*)    Glucose, Bld 108 (*)    All other components within normal limits  CBC - Abnormal; Notable for the following:    WBC 12.9 (*)    All other components within normal limits  URINALYSIS COMPLETEWITH MICROSCOPIC ([facility] ONLY) - Abnormal; Notable for the following:    Color, Urine AMBER (*)    APPearance CLOUDY (*)    Ketones, ur 2+ (*)    Hgb urine dipstick 2+ (*)    Protein, ur 100 (*)    Leukocytes, UA 3+ (*)     Bacteria, UA RARE (*)    Squamous Epithelial / LPF 6-30 (*)    All other components within normal limits  HCG, QUANTITATIVE, PREGNANCY - Abnormal; Notable for the following:    hCG, Beta Chain, Quant, S 7,179 (*)    All other components within normal limits  POCT PREGNANCY, URINE - Abnormal; Notable for the following:    Preg Test, Ur POSITIVE (*)    All other components within normal limits  CHLAMYDIA/NGC RT PCR ([facility] ONLY)  URINE CULTURE  GLUCOSE, CAPILLARY  CBG MONITORING, ED  POC URINE PREG, ED  ABO/RH   ____________________________________________  EKG  ED ECG REPORT I, Liliani Bobo, the attending physician, personally viewed and interpreted this ECG.  Date: 05/02/2016 EKG Time: 18:52 Rate: 135 Rhythm: Sinus tachycardia QRS Axis: normal Intervals:  normal ST/T Wave abnormalities: normal Conduction Disturbances: none Narrative Interpretation: unremarkable  ____________________________________________  RADIOLOGY   US Ob Comp Less 14 Wks  Result Date: 05/02/2016 CLINICAL DATA:  35 year old female with positive HCG levels presenting with vaginal bleeding. EXAM: OBSTETRIC <14 WK [place] AND TRANSVAGINAL OB US TECHNIQUE: Both transabdominal and transvaginal ultrasound examinations were performed for complete evaluation of the gestation as well as the maternal uterus, adnexal regions, and pelvic cul-de-sac. Transvaginal technique was performed to assess early pregnancy. COMPARISON:  None for this pregnancy FINDINGS: The uterus is heterogeneous. There is a cystic structure within the endometrial canal. No yolk sac or fetal pole identified within this cystic structure. This cystic structure may represent an early gestational sac, or a blighted ovum. Pseudo gestation of an ectopic pregnancy is not excluded. Correlation with clinical exam and follow-up with serial HCG levels and ultrasound recommended. If this cystic structure is a true gestational sac the estimated  gestational age based on mean sac diameter of 12 mm is 6 weeks, 0 days. There is a small subchorionic hemorrhage measuring 1.1 x 0.7 cm. The maternal ovaries appear unremarkable. No significant free fluid within pelvis. IMPRESSION: Intrauterine cystic structure as described may represent an early gestational sac or a blighted ovum. Ectopic pregnancy is not excluded. Correlation with clinical exam and follow-up with serial HCG levels and ultrasound recommended. Small subchorionic hemorrhage.  Follow-up recommended. Electronically Signed   By: Elgie Collard M.D.   On: 05/02/2016 22:30  US Ob Transvaginal  Result Date: 05/02/2016 CLINICAL DATA:  35 year old female with positive HCG levels presenting with vaginal bleeding. EXAM: OBSTETRIC <14 WK [place] AND TRANSVAGINAL OB US TECHNIQUE: Both transabdominal and transvaginal ultrasound examinations were performed for complete evaluation of the gestation as well as the maternal uterus, adnexal regions, and pelvic cul-de-sac. Transvaginal technique was performed to assess early pregnancy. COMPARISON:  None for this pregnancy FINDINGS: The uterus is heterogeneous. There is a cystic structure within the endometrial canal. No yolk sac or fetal pole identified within this cystic structure. This cystic structure may represent an early gestational sac, or a blighted ovum. Pseudo gestation of an ectopic pregnancy is not excluded. Correlation with clinical exam and follow-up with serial HCG levels and ultrasound recommended. If this cystic structure is a true gestational sac the estimated gestational age based on mean sac diameter of 12 mm is 6 weeks, 0 days. There is a small subchorionic hemorrhage measuring 1.1 x 0.7 cm. The maternal ovaries appear unremarkable. No significant free fluid within pelvis. IMPRESSION: Intrauterine cystic structure as described may represent an early gestational sac or a blighted ovum. Ectopic pregnancy is not excluded. Correlation with clinical  exam and follow-up with serial HCG levels and ultrasound recommended. Small subchorionic hemorrhage.  Follow-up recommended. Electronically Signed   By: Elgie Collard M.D.   On: 05/02/2016 22:30   ____________________________________________   PROCEDURES  Procedure(s) performed:   Procedures   ____________________________________________   INITIAL IMPRESSION / ASSESSMENT AND PLAN / ED COURSE  Pertinent labs & imaging results that were available during my care of the patient were reviewed by me and considered in my medical decision making (see chart for details).  I was concerned about the patient's vital signs but her heart rate is improving somewhat to the 120s with about a third of a liter of fluids.  She is well-appearing in spite of her symptoms and her tachycardia and I plan on giving a full 2 L of fluid at this point.  Obviously I am concerned  about ectopic pregnancy, threatened/inevitable abortion, etc.  As soon as her hCG has resulted we will obtain an ultrasound and I will plan for a pelvic exam as well.    Clinical Course  Value Comment By Time  HCG, Beta Chain, Quant, S: (!) 7,179 HCG has resulted, I will now order an ultrasound for early pregnancy vaginal bleeding.  Pelvic exam was unremarkable except for copious yellowish white discharge.  Patient is hemodynamically stable although she remains tachycardic in the 110s.  Rh+, no indication for RhoGAM. Loleta Rose, MD 07/26 2053  Clue Cells Wet Prep HPF POC: (!) PRESENT +BV, will prescribe Flagyl if patient able to be discharged. Loleta Rose, MD 07/26 2210  US OB Comp Less 14 Wks I discussed the abnormal findings with the patient and I have placed a page to Dr. Dalbert Garnet with GYN to discuss in terms of current management and setting up follow-up. Loleta Rose, MD 07/26 2242   As I was waiting to hear back from Dr. Dalbert Garnet I became more concerned about the constellation of symptoms and diagnoses including hypokalemia,  persistent abdominal tenderness to palpation, questionable ectopic, the initial tachycardia in the 140s, mild leukocytosis, UTI during pregnancy, bacterial vaginosis, etc.  After 2 L of fluid the patient remains slightly tachycardic at a rate of around 100.  Under the circumstances I would appreciate evaluation of the patient in the emergency department.  I called and spoke with the certified nurse midwife of Dr. Dalbert Garnet because Dr. Dalbert Garnet is in the operating room at this time.  I explained the clinical scenario and my concerns.  This is Dr. Dalbert Garnet is available she will follow up with the ED physician and come to the emergency department to evaluate the patient in person.  I am transferring ED care to Dr. Manson Passey at this time.  I updated the patient and her husband about the plan and they understand and agree. Loleta Rose, MD 07/26 2320   11:43 PM:  After I transferred care to Dr. Manson Passey and the patient was moved to the B side, she started taking about wanting to leave without OB/GYN evaluation.  I had another discussion with her.  I pointed out that she is again tachy in the 115-120 range at rest, she is shuddering (feeling cold) now, and she still has moderate tenderness to palpation of her abdomen.  I explained she is risking her health if she leaves AMA cautioned strongly against it.  Her husband agrees and promised me she will stay for OB/GYN evaluation.  I explained that the specialist may send her home, but she needs evaluation in person first.  I am giving her another liter of warmed saline due to the tachycardia and chills, and we are awaiting callback from Dr. Dalbert Garnet when she finishes in the OR.  Updated Dr. Manson Passey in person. ____________________________________________  FINAL CLINICAL IMPRESSION(S) / ED DIAGNOSES  Final diagnoses:  BV (bacterial vaginosis)  Vaginal bleeding in pregnancy, first trimester  Subchorionic hemorrhage, first trimester  UTI (urinary tract infection) during pregnancy,  first trimester  Hypokalemia  Abdominal pain   MEDICATIONS GIVEN DURING THIS VISIT:  Medications  potassium chloride SA (K-DUR,KLOR-CON) CR tablet 40 mEq (not administered)  cephALEXin (KEFLEX) capsule 500 mg (not administered)  metroNIDAZOLE (FLAGYL) tablet 500 mg (not administered)  sodium chloride 0.9 % bolus 1,000 mL (not administered)  sodium chloride 0.9 % bolus 1,000 mL (1,000 mLs Intravenous New Bag/Given 05/02/16 2017)     NEW OUTPATIENT MEDICATIONS STARTED DURING THIS VISIT:  New Prescriptions   CEPHALEXIN (KEFLEX) 500 MG CAPSULE    Take 1 capsule (500 mg total) by mouth 3 (three) times daily.   METRONIDAZOLE (FLAGYL) 500 MG TABLET    Take 1 tablet (500 mg total) by mouth 2 (two) times daily.   ONDANSETRON (ZOFRAN) 4 MG TABLET    Take 1-2 tabs by mouth every 8 hours as needed for nausea/vomiting   POTASSIUM CHLORIDE SA (KLOR-CON M20) 20 MEQ TABLET    Take 1 tablet (20 mEq total) by mouth daily.      Note:  This document was prepared using Dragon voice recognition software and may include unintentional dictation errors.    Loleta Rose, MD 05/02/16 1610    Loleta Rose, MD 05/02/16 [phone]

## 2016-05-02 NOTE — ED Triage Notes (Signed)
Pt presents to ED with reports of vaginal bleeding and possible miscarriage. Pt states she missed one period but has not taken a pregnancy test yet. Pt reports she started having bleeding yesterday and she has passed some dime sized clots. Pt reports shaking and chills. Pt tachycardic in triage. Pt reports nausea but denies vomiting and diarrhea.

## 2016-05-02 NOTE — ED Notes (Signed)
Pt returned from U/S via stretcher. 

## 2016-05-02 NOTE — ED Notes (Addendum)
Pt states vaginal bleeding x 2 days. States passing some clots off and on. Cramping off and on as well. States some nausea and fever but denies vomiting or diarrhea. States LMP about 2 months ago. States has saturated 4-5 pads since symptoms began yesterday.

## 2016-05-03 DIAGNOSIS — N719 Inflammatory disease of uterus, unspecified: Secondary | ICD-10-CM | POA: Diagnosis present

## 2016-05-03 LAB — BASIC METABOLIC PANEL
ANION GAP: 8 (ref 5–15)
BUN: 7 mg/dL (ref 6–20)
CALCIUM: 7.6 mg/dL — AB (ref 8.9–10.3)
CO2: 18 mmol/L — AB (ref 22–32)
CREATININE: 0.48 mg/dL (ref 0.44–1.00)
Chloride: 109 mmol/L (ref 101–111)
GLUCOSE: 94 mg/dL (ref 65–99)
Potassium: 3.1 mmol/L — ABNORMAL LOW (ref 3.5–5.1)
Sodium: 135 mmol/L (ref 135–145)

## 2016-05-03 LAB — CBC WITH DIFFERENTIAL/PLATELET
BASOS ABS: 0 10*3/uL (ref 0–0.1)
BASOS PCT: 0 %
EOS ABS: 0 10*3/uL (ref 0–0.7)
Eosinophils Relative: 0 %
HCT: 33.6 % — ABNORMAL LOW (ref 35.0–47.0)
Hemoglobin: 11.9 g/dL — ABNORMAL LOW (ref 12.0–16.0)
Lymphocytes Relative: 7 %
Lymphs Abs: 0.5 10*3/uL — ABNORMAL LOW (ref 1.0–3.6)
MCH: 33 pg (ref 26.0–34.0)
MCHC: 35.6 g/dL (ref 32.0–36.0)
MCV: 92.9 fL (ref 80.0–100.0)
MONO ABS: 0.5 10*3/uL (ref 0.2–0.9)
MONOS PCT: 7 %
NEUTROS ABS: 5.9 10*3/uL (ref 1.4–6.5)
Neutrophils Relative %: 86 %
PLATELETS: 127 10*3/uL — AB (ref 150–440)
RBC: 3.62 MIL/uL — ABNORMAL LOW (ref 3.80–5.20)
RDW: 13.5 % (ref 11.5–14.5)
WBC: 6.9 10*3/uL (ref 3.6–11.0)

## 2016-05-03 MED ORDER — POTASSIUM CHLORIDE CRYS ER 20 MEQ PO TBCR
40.0000 meq | EXTENDED_RELEASE_TABLET | Freq: Two times a day (BID) | ORAL | Status: DC
  Administered 2016-05-03 – 2016-05-05 (×4): 40 meq via ORAL
  Filled 2016-05-03 (×4): qty 2

## 2016-05-03 MED ORDER — MAGNESIUM HYDROXIDE 400 MG/5ML PO SUSP: 30.0000 mL | Freq: Every day | ORAL | Status: DC | PRN

## 2016-05-03 MED ORDER — CLINDAMYCIN PHOSPHATE 900 MG/50ML IV SOLN
900.0000 mg | Freq: Three times a day (TID) | INTRAVENOUS | Status: DC
Start: 2016-05-03 — End: 2016-05-04
  Administered 2016-05-03 – 2016-05-04 (×5): 900 mg via INTRAVENOUS
  Filled 2016-05-03 (×6): qty 50

## 2016-05-03 MED ORDER — ONDANSETRON HCL 4 MG PO TABS: 4.0000 mg | ORAL_TABLET | Freq: Four times a day (QID) | ORAL | Status: DC | PRN

## 2016-05-03 MED ORDER — MONTELUKAST SODIUM 10 MG PO TABS
10.0000 mg | ORAL_TABLET | Freq: Every day | ORAL | Status: DC
  Administered 2016-05-03 – 2016-05-04 (×2): 10 mg via ORAL
  Filled 2016-05-03 (×3): qty 1

## 2016-05-03 MED ORDER — ALBUTEROL SULFATE (2.5 MG/3ML) 0.083% IN NEBU: 3.0000 mL | INHALATION_SOLUTION | Freq: Four times a day (QID) | RESPIRATORY_TRACT | Status: DC | PRN

## 2016-05-03 MED ORDER — BISACODYL 5 MG PO TBEC
5.0000 mg | DELAYED_RELEASE_TABLET | Freq: Every day | ORAL | Status: DC | PRN
  Filled 2016-05-03: qty 1

## 2016-05-03 MED ORDER — OXYCODONE-ACETAMINOPHEN 5-325 MG PO TABS
1.0000 | ORAL_TABLET | Freq: Three times a day (TID) | ORAL | Status: DC | PRN
  Administered 2016-05-03: 1 via ORAL
  Filled 2016-05-03: qty 1

## 2016-05-03 MED ORDER — METRONIDAZOLE 500 MG PO TABS
500.0000 mg | ORAL_TABLET | Freq: Two times a day (BID) | ORAL | Status: DC
  Administered 2016-05-03 – 2016-05-05 (×5): 500 mg via ORAL
  Filled 2016-05-03 (×5): qty 1

## 2016-05-03 MED ORDER — DOCUSATE SODIUM 100 MG PO CAPS
100.0000 mg | ORAL_CAPSULE | Freq: Two times a day (BID) | ORAL | Status: DC
  Administered 2016-05-03: 100 mg via ORAL
  Filled 2016-05-03 (×5): qty 1

## 2016-05-03 MED ORDER — TEMAZEPAM 15 MG PO CAPS: 15.0000 mg | ORAL_CAPSULE | Freq: Every evening | ORAL | Status: DC | PRN

## 2016-05-03 MED ORDER — GENTAMICIN SULFATE 40 MG/ML IJ SOLN
5.0000 mg/kg | INTRAVENOUS | Status: DC
  Administered 2016-05-03 – 2016-05-04 (×2): 360 mg via INTRAVENOUS
  Filled 2016-05-03 (×3): qty 9

## 2016-05-03 MED ORDER — MOMETASONE FURO-FORMOTEROL FUM 100-5 MCG/ACT IN AERO
2.0000 | INHALATION_SPRAY | Freq: Two times a day (BID) | RESPIRATORY_TRACT | Status: DC
  Administered 2016-05-03 – 2016-05-05 (×5): 2 via RESPIRATORY_TRACT
  Filled 2016-05-03: qty 8.8

## 2016-05-03 MED ORDER — LACTATED RINGERS IV SOLN
INTRAVENOUS | Status: DC
  Administered 2016-05-03 – 2016-05-04 (×4): via INTRAVENOUS

## 2016-05-03 MED ORDER — ALUM & MAG HYDROXIDE-SIMETH 200-200-20 MG/5ML PO SUSP: 30.0000 mL | ORAL | Status: DC | PRN

## 2016-05-03 MED ORDER — ACETAMINOPHEN 325 MG PO TABS
650.0000 mg | ORAL_TABLET | Freq: Once | ORAL | Status: AC
  Administered 2016-05-03: 650 mg via ORAL
  Filled 2016-05-03: qty 2

## 2016-05-03 MED ORDER — MAGNESIUM CITRATE PO SOLN: 1.0000 | Freq: Once | ORAL | Status: DC | PRN

## 2016-05-03 MED ORDER — ONDANSETRON HCL 4 MG/2ML IJ SOLN: 4.0000 mg | Freq: Four times a day (QID) | INTRAMUSCULAR | Status: DC | PRN

## 2016-05-03 NOTE — H&P (Signed)
Consult History and Physical   SERVICE: Gynecology   Patient Name: Christine Bowers Patient MRN:   [id number]  CC: Abdominal pain, intermittent spotting, chills  HPI: Christine Bowers is a 35 y.o. [phone] (pt reports [phone]) with pregnancy diagnosed today in the emergency department. Last menstrual period approximately 8 weeks ago, but is currently unknown. She is admitted with abdominal pain, and intermittent vaginal spotting, and chills at home.   Ultrasound in the emergency department noted a cystic sac in her endometrium measuring 12 mm, which would put her at 6 weeks 0 days if this is a true gestational sac. It also notes a small subchorionic hemorrhage, and significantly, the ultrasound found no free fluid within her pelvis.  Beta hCG levels last night at 7:30 were 7179. She is Rh+. Hemoglobin 14.5, white blood cell count elevated at 12.9.. Her potassium is low at 2.8, and she has received oral repletion in the emergency department.  Wet mount shows clue cells, no yeast. Other STI vaginal infection tests were negative. UA significant for protein, glucose, and leukocytes. It is for nitrites. She denies dysuria.  Review of Systems: positives in bold GEN:   fevers, chills, weight changes, appetite changes, fatigue, night sweats HEENT:  HA, vision changes, hearing loss, congestion, rhinorrhea, sinus pressure, dysphagia CV:   CP, palpitations PULM:  SOB, cough GI:  abd pain, N/V/D/C GU:  dysuria, urgency, frequency MSK:  arthralgias, myalgias, back pain, swelling SKIN:  rashes, color changes, pallor NEURO:  numbness, weakness, tingling, seizures, dizziness, tremors PSYCH:  depression, anxiety, behavioral problems, confusion  HEME/LYMPH:  easy bruising or bleeding ENDO:  heat/cold intolerance  Past Obstetrical History: OB History    Gravida Para Term Preterm AB Living   6 3 3   3 3    SAB TAB Ectopic Multiple Live Births                  Past Gynecologic History: Patient's  last menstrual period was 03/10/2016.  Past Medical History: Past Medical History:  Diagnosis Date  . Asthma   . Miscarriage     Past Surgical History:   Past Surgical History:  Procedure Laterality Date  . DILATION AND CURETTAGE OF UTERUS      Family History:  family history is not on file.  Social History:  Social History   Social History  . Marital status: Single    Spouse name: N/A  . Number of children: N/A  . Years of education: N/A   Occupational History  . Not on file.   Social History Main Topics  . Smoking status: Current Every Day Smoker  . Smokeless tobacco: Not on file  . Alcohol use Yes  . Drug use: No  . Sexual activity: Yes   Other Topics Concern  . Not on file   Social History Narrative  . No narrative on file    Home Medications:  Medications reconciled in EPIC  No current facility-administered medications on file prior to encounter.    Current Outpatient Prescriptions on File Prior to Encounter  Medication Sig Dispense Refill  . albuterol (PROVENTIL HFA;VENTOLIN HFA) 108 (90 BASE) MCG/ACT inhaler Inhale 2 puffs into the lungs every 6 (six) hours as needed for wheezing or shortness of breath. 1 Inhaler 2    Allergies:  Allergies  Allergen Reactions  . Aspirin Other (See Comments)  . Ibuprofen Other (See Comments)    Physical Exam:  Temp:  [98.7 F (37.1 C)-102 F (38.9 C)] 102 F (38.9 C) (  07/27 0113) Pulse Rate:  [97-142] 115 (07/27 0203) Resp:  [15-26] 20 (07/27 0203) BP: (101-120)/(56-78) 107/60 (07/27 0130) SpO2:  [95 %-100 %] 95 % (07/27 0203) Weight:  [160 lb (72.6 kg)] 160 lb (72.6 kg) (07/26 1852)   General Appearance:  Well developed, well nourished, no acute distress, alert and oriented x3 HEENT:  Normocephalic atraumatic, extraocular movements intact, moist mucous membranes Cardiovascular:  Normal S1/S2, regular rate and rhythm, no murmurs Pulmonary:  clear to auscultation, no wheezes, rales or rhonchi, symmetric  air entry, good air exchange Abdomen:  Bowel sounds present, soft, nontender, nondistended, no abnormal masses, no epigastric pain Extremities:  Full range of motion, no pedal edema, 2+ distal pulses, no tenderness Skin:  normal coloration and turgor, no rashes, no suspicious skin lesions noted  Neurologic:  Cranial nerves 2-12 grossly intact, normal muscle tone, strength 5/5 all four extremities Psychiatric:  Normal mood and affect, appropriate, no AH/VH Pelvic:  NEFG, no vulvar masses or lesions, normal vaginal mucosa, no vaginal bleeding or discharge, cervix without lesions or erythema, tender to palpation uterus, no adnexal masses appreciated no bladder pain.    Labs/Studies:   CBC and Coags:  Lab Results  Component Value Date   WBC 12.9 (H) 05/02/2016   HGB 14.5 05/02/2016   HCT 41.9 05/02/2016   MCV 93.4 05/02/2016   PLT 175 05/02/2016   INR 1.3 03/09/2013   CMP:  Lab Results  Component Value Date   NA 136 05/02/2016   K 2.8 (L) 05/02/2016   CL 104 05/02/2016   CO2 21 (L) 05/02/2016   BUN 11 05/02/2016   CREATININE 0.59 05/02/2016   CREATININE 0.68 03/09/2013   PROT 5.8 (L) 03/09/2013   BILITOT 0.5 03/09/2013   ALT 10 (L) 03/09/2013   AST 14 (L) 03/09/2013   ALKPHOS 38 (L) 03/09/2013   Other Labs:  Other Imaging: US Ob Comp Less 14 Wks  Result Date: 05/02/2016 CLINICAL DATA:  35 year old female with positive HCG levels presenting with vaginal bleeding. EXAM: OBSTETRIC <14 WK [place] AND TRANSVAGINAL OB US TECHNIQUE: Both transabdominal and transvaginal ultrasound examinations were performed for complete evaluation of the gestation as well as the maternal uterus, adnexal regions, and pelvic cul-de-sac. Transvaginal technique was performed to assess early pregnancy. COMPARISON:  None for this pregnancy FINDINGS: The uterus is heterogeneous. There is a cystic structure within the endometrial canal. No yolk sac or fetal pole identified within this cystic structure. This cystic  structure may represent an early gestational sac, or a blighted ovum. Pseudo gestation of an ectopic pregnancy is not excluded. Correlation with clinical exam and follow-up with serial HCG levels and ultrasound recommended. If this cystic structure is a true gestational sac the estimated gestational age based on mean sac diameter of 12 mm is 6 weeks, 0 days. There is a small subchorionic hemorrhage measuring 1.1 x 0.7 cm. The maternal ovaries appear unremarkable. No significant free fluid within pelvis. IMPRESSION: Intrauterine cystic structure as described may represent an early gestational sac or a blighted ovum. Ectopic pregnancy is not excluded. Correlation with clinical exam and follow-up with serial HCG levels and ultrasound recommended. Small subchorionic hemorrhage.  Follow-up recommended. Electronically Signed   By: Elgie Collard M.D.   On: 05/02/2016 22:30  US Ob Transvaginal  Result Date: 05/02/2016 CLINICAL DATA:  35 year old female with positive HCG levels presenting with vaginal bleeding. EXAM: OBSTETRIC <14 WK [place] AND TRANSVAGINAL OB US TECHNIQUE: Both transabdominal and transvaginal ultrasound examinations were performed for complete evaluation of  the gestation as well as the maternal uterus, adnexal regions, and pelvic cul-de-sac. Transvaginal technique was performed to assess early pregnancy. COMPARISON:  None for this pregnancy FINDINGS: The uterus is heterogeneous. There is a cystic structure within the endometrial canal. No yolk sac or fetal pole identified within this cystic structure. This cystic structure may represent an early gestational sac, or a blighted ovum. Pseudo gestation of an ectopic pregnancy is not excluded. Correlation with clinical exam and follow-up with serial HCG levels and ultrasound recommended. If this cystic structure is a true gestational sac the estimated gestational age based on mean sac diameter of 12 mm is 6 weeks, 0 days. There is a small subchorionic  hemorrhage measuring 1.1 x 0.7 cm. The maternal ovaries appear unremarkable. No significant free fluid within pelvis. IMPRESSION: Intrauterine cystic structure as described may represent an early gestational sac or a blighted ovum. Ectopic pregnancy is not excluded. Correlation with clinical exam and follow-up with serial HCG levels and ultrasound recommended. Small subchorionic hemorrhage.  Follow-up recommended. Electronically Signed   By: Elgie Collard M.D.   On: 05/02/2016 22:30    Assessment / Plan:   Christine Bowers is a 35 y.o. [id number] who presents with Abdominal pain, vaginal bleeding, and an early pregnancy. Differential diagnosis includes endometritis, urinary tract infection, incomplete abortion, ectopic pregnancy.  1. We'll begin IV antibiotics for possible endometritis with gentamicin and clindamycin, which may also cover UTI. Her urine catch was dirty, so UTI is lower on my list.. Follow-up beta hCGs in 48 hours, with or without another transvaginal ultrasound as clinically indicated. Will monitor closely in case of possible ectopic. If no clinical improvement in 24-48 hours, will consider blood cultures. 2. Will treat bacterial vaginosis with Flagyl. 3. Nothing by mouth until clinical improvement, in case of OR. 4. Admit to the floor. SCDs, ambulate as desired. 5. Asthma: We'll treat with home medications. 6. Hypokalemia: Continue po repletion   Thank you for the opportunity to be involved with this pt's care.

## 2016-05-03 NOTE — Progress Notes (Signed)
Subjective: Patient reports chills/fever, anorexia. Pain improved from last night, now in LLQ but not across abdomen. Brown/red spotting on wiping, no significant vaginal bleeding. Appears ill, but significantly improved   Objective: I have reviewed patient's vital signs, intake and output, medications and labs.  General: alert, cooperative, appears stated age and mild distress Resp: clear to auscultation bilaterally Cardio: regular rate and rhythm, S1, S2 normal, no murmur, click, rub or gallop GI: soft, ; bowel sounds normal; no masses,  no organomegaly and tenderness with and without palpation in LLQ, no guarding, no s/s of acute abdomen Extremities: extremities normal, atraumatic, no cyanosis or edema   Assessment/Plan: Working ddx septic abortion. Also preg of unknown location, possible ectopic, possible early SAB - continue IV antibiotics as appears to be improving. No fever since admitted, but still having chills - repeat beta quant tomorrow, U/S in the morning. If abnormally rising betas, will take for [facility], laparoscopy. - blood cultures now  1. Acute anemia: Likely dilutional effect, as abdominal exam without evidence of rupture of bleeding.  2. Possible SAB: May start vaginal bleeding. Consider D&C if u/s confirms no viable IUP 3. NPO after midnight in case of D&C tomorrow, or if ectopic requiring surgical intervention.   LOS: 0 days    Christine Bowers 05/03/2016, 5:29 PM

## 2016-05-03 NOTE — ED Notes (Signed)
Pt OB/Gyn at bedside.

## 2016-05-03 NOTE — Progress Notes (Signed)
Obstetric and Gynecology  Subjective  Christine Bowers is a 35 y.o. female [phone] who presented on 05/02/2016 for lower pelvic pain and feels washed out. Pt also had chills and intermittant spotting and was seen in the ER at 0300 with these complaints. Pt continues to feel some improvement but, still having some discomfort in the LLQ this am. NO N,V,C,D and no complaint of chills this am.      Objective   Vitals:   05/03/16 0424 05/03/16 0829  BP: (!) 100/48 (!) 93/51  Pulse: 82 86  Resp: 20 18  Temp: 98.2 F (36.8 C) 98.6 F (37 C)     Intake/Output Summary (Last 24 hours) at 05/03/16 0859 Last data filed at 05/03/16 0800  Gross per 24 hour  Intake           468.75 ml  Output              175 ml  Net           293.75 ml    General: NAD Cardiovascular: RRR, no murmurs Pulmonary: CTAB, No W/R/R. Abdomen: TTP on the LLQ. , +BS, no guarding. Pt is not tender in the midline or the Rt side.  Extremities: No erythema,, no calf tenderness, +warmth with normal peripheral pulses.  Labs: Results for orders placed or performed during the hospital encounter of 05/02/16 (from the past 24 hour(s))  Urinalysis complete, with microscopic     Status: Abnormal   Collection Time: 05/02/16  6:58 PM  Result Value Ref Range   Color, Urine AMBER (A) YELLOW   APPearance CLOUDY (A) CLEAR   Glucose, UA NEGATIVE NEGATIVE mg/dL   Bilirubin Urine NEGATIVE NEGATIVE   Ketones, ur 2+ (A) NEGATIVE mg/dL   Specific Gravity, Urine 1.029 1.005 - 1.030   Hgb urine dipstick 2+ (A) NEGATIVE   pH 5.0 5.0 - 8.0   Protein, ur 100 (A) NEGATIVE mg/dL   Nitrite NEGATIVE NEGATIVE   Leukocytes, UA 3+ (A) NEGATIVE   RBC / HPF 6-30 0 - 5 RBC/hpf   WBC, UA 6-30 0 - 5 WBC/hpf   Bacteria, UA RARE (A) NONE SEEN   Squamous Epithelial / LPF 6-30 (A) NONE SEEN   Mucous PRESENT   Basic metabolic panel     Status: Abnormal   Collection Time: 05/02/16  7:01 PM  Result Value Ref Range   Sodium 136 135 - 145 mmol/L   Potassium 2.8 (L) 3.5 - 5.1 mmol/L   Chloride 104 101 - 111 mmol/L   CO2 21 (L) 22 - 32 mmol/L   Glucose, Bld 108 (H) 65 - 99 mg/dL   BUN 11 6 - 20 mg/dL   Creatinine, Ser 4.54 0.44 - 1.00 mg/dL   Calcium 9.3 8.9 - 09.8 mg/dL   GFR calc non Af Amer >60 >60 mL/min   GFR calc Af Amer >60 >60 mL/min   Anion gap 11 5 - 15  CBC     Status: Abnormal   Collection Time: 05/02/16  7:01 PM  Result Value Ref Range   WBC 12.9 (H) 3.6 - 11.0 K/uL   RBC 4.49 3.80 - 5.20 MIL/uL   Hemoglobin 14.5 12.0 - 16.0 g/dL   HCT 11.9 14.7 - 82.9 %   MCV 93.4 80.0 - 100.0 fL   MCH 32.3 26.0 - 34.0 pg   MCHC 34.6 32.0 - 36.0 g/dL   RDW 56.2 13.0 - 86.5 %   Platelets 175 150 - 440 K/uL  ABO/Rh  Status: None   Collection Time: 05/02/16  7:01 PM  Result Value Ref Range   ABO/RH(D) O POS   Pregnancy, urine POC     Status: Abnormal   Collection Time: 05/02/16  7:09 PM  Result Value Ref Range   Preg Test, Ur POSITIVE (A) NEGATIVE  hCG, quantitative, pregnancy     Status: Abnormal   Collection Time: 05/02/16  7:27 PM  Result Value Ref Range   hCG, Beta Chain, Quant, S 7,179 (H) <5 mIU/mL  Glucose, capillary     Status: None   Collection Time: 05/02/16  7:32 PM  Result Value Ref Range   Glucose-Capillary 86 65 - 99 mg/dL  Wet prep, genital     Status: Abnormal   Collection Time: 05/02/16  8:50 PM  Result Value Ref Range   Yeast Wet Prep HPF POC NONE SEEN NONE SEEN   Trich, Wet Prep NONE SEEN NONE SEEN   Clue Cells Wet Prep HPF POC PRESENT (A) NONE SEEN   WBC, Wet Prep HPF POC MANY (A) NONE SEEN   Sperm NONE SEEN   Chlamydia/NGC rt PCR ([facility] only)     Status: None   Collection Time: 05/02/16  8:50 PM  Result Value Ref Range   Specimen source GC/Chlam ENDOCERVICAL    Chlamydia Tr NOT DETECTED NOT DETECTED   N gonorrhoeae NOT DETECTED NOT DETECTED  Basic metabolic panel     Status: Abnormal   Collection Time: 05/03/16  3:37 AM  Result Value Ref Range   Sodium 135 135 - 145 mmol/L   Potassium 3.1  (L) 3.5 - 5.1 mmol/L   Chloride 109 101 - 111 mmol/L   CO2 18 (L) 22 - 32 mmol/L   Glucose, Bld 94 65 - 99 mg/dL   BUN 7 6 - 20 mg/dL   Creatinine, Ser 2.44 0.44 - 1.00 mg/dL   Calcium 7.6 (L) 8.9 - 10.3 mg/dL   GFR calc non Af Amer >60 >60 mL/min   GFR calc Af Amer >60 >60 mL/min   Anion gap 8 5 - 15    Cultures: Results for orders placed or performed during the hospital encounter of 05/02/16  Wet prep, genital     Status: Abnormal   Collection Time: 05/02/16  8:50 PM  Result Value Ref Range Status   Yeast Wet Prep HPF POC NONE SEEN NONE SEEN Final   Trich, Wet Prep NONE SEEN NONE SEEN Final   Clue Cells Wet Prep HPF POC PRESENT (A) NONE SEEN Final   WBC, Wet Prep HPF POC MANY (A) NONE SEEN Final   Sperm NONE SEEN  Final  Chlamydia/NGC rt PCR ([facility] only)     Status: None   Collection Time: 05/02/16  8:50 PM  Result Value Ref Range Status   Specimen source GC/Chlam ENDOCERVICAL  Final   Chlamydia Tr NOT DETECTED NOT DETECTED Final   N gonorrhoeae NOT DETECTED NOT DETECTED Final    Comment: (NOTE) 100  This methodology has not been evaluated in pregnant women or in 200  patients with a history of hysterectomy. 300 400  This methodology will not be performed on patients less than 28  years of age.     Imaging: US Ob Comp Less 14 Wks  Result Date: 05/02/2016 CLINICAL DATA:  35 year old female with positive HCG levels presenting with vaginal bleeding. EXAM: OBSTETRIC <14 WK [place] AND TRANSVAGINAL OB US TECHNIQUE: Both transabdominal and transvaginal ultrasound examinations were performed for complete evaluation of the gestation as well  as the maternal uterus, adnexal regions, and pelvic cul-de-sac. Transvaginal technique was performed to assess early pregnancy. COMPARISON:  None for this pregnancy FINDINGS: The uterus is heterogeneous. There is a cystic structure within the endometrial canal. No yolk sac or fetal pole identified within this cystic structure. This cystic structure may  represent an early gestational sac, or a blighted ovum. Pseudo gestation of an ectopic pregnancy is not excluded. Correlation with clinical exam and follow-up with serial HCG levels and ultrasound recommended. If this cystic structure is a true gestational sac the estimated gestational age based on mean sac diameter of 12 mm is 6 weeks, 0 days. There is a small subchorionic hemorrhage measuring 1.1 x 0.7 cm. The maternal ovaries appear unremarkable. No significant free fluid within pelvis. IMPRESSION: Intrauterine cystic structure as described may represent an early gestational sac or a blighted ovum. Ectopic pregnancy is not excluded. Correlation with clinical exam and follow-up with serial HCG levels and ultrasound recommended. Small subchorionic hemorrhage.  Follow-up recommended. Electronically Signed   By: Elgie Collard M.D.   On: 05/02/2016 22:30  US Ob Transvaginal  Result Date: 05/02/2016 CLINICAL DATA:  35 year old female with positive HCG levels presenting with vaginal bleeding. EXAM: OBSTETRIC <14 WK [place] AND TRANSVAGINAL OB US TECHNIQUE: Both transabdominal and transvaginal ultrasound examinations were performed for complete evaluation of the gestation as well as the maternal uterus, adnexal regions, and pelvic cul-de-sac. Transvaginal technique was performed to assess early pregnancy. COMPARISON:  None for this pregnancy FINDINGS: The uterus is heterogeneous. There is a cystic structure within the endometrial canal. No yolk sac or fetal pole identified within this cystic structure. This cystic structure may represent an early gestational sac, or a blighted ovum. Pseudo gestation of an ectopic pregnancy is not excluded. Correlation with clinical exam and follow-up with serial HCG levels and ultrasound recommended. If this cystic structure is a true gestational sac the estimated gestational age based on mean sac diameter of 12 mm is 6 weeks, 0 days. There is a small subchorionic hemorrhage measuring  1.1 x 0.7 cm. The maternal ovaries appear unremarkable. No significant free fluid within pelvis. IMPRESSION: Intrauterine cystic structure as described may represent an early gestational sac or a blighted ovum. Ectopic pregnancy is not excluded. Correlation with clinical exam and follow-up with serial HCG levels and ultrasound recommended. Small subchorionic hemorrhage.  Follow-up recommended. Electronically Signed   By: Elgie Collard M.D.   On: 05/02/2016 22:30    Assessment   35 y.o. [id number] Hospital Day: 2 Stable with discomfort in LLQ.  Ectopic cannot be ruled out at this time.   Plan   1. Continue to observe for S/S of ectopic or misscarriage. 2. BHCG in 48 hrs.

## 2016-05-04 ENCOUNTER — Observation Stay: Payer: BLUE CROSS/BLUE SHIELD | Admitting: Registered Nurse

## 2016-05-04 ENCOUNTER — Encounter
Admission: EM | Disposition: A | Discharge status: Home / Self Care | Payer: Self-pay | Source: Home / Self Care | Attending: Obstetrics and Gynecology

## 2016-05-04 ENCOUNTER — Observation Stay: Payer: BLUE CROSS/BLUE SHIELD

## 2016-05-04 HISTORY — PX: DILATION AND EVACUATION: SHX1459

## 2016-05-04 LAB — BLOOD CULTURE ID PANEL (REFLEXED)
Acinetobacter baumannii: NOT DETECTED
CANDIDA PARAPSILOSIS: NOT DETECTED
CANDIDA TROPICALIS: NOT DETECTED
CARBAPENEM RESISTANCE: NOT DETECTED
Candida albicans: NOT DETECTED
Candida glabrata: NOT DETECTED
Candida krusei: NOT DETECTED
ENTEROBACTERIACEAE SPECIES: DETECTED — AB
ENTEROCOCCUS SPECIES: NOT DETECTED
Enterobacter cloacae complex: NOT DETECTED
Escherichia coli: DETECTED — AB
Haemophilus influenzae: NOT DETECTED
KLEBSIELLA PNEUMONIAE: NOT DETECTED
Klebsiella oxytoca: NOT DETECTED
LISTERIA MONOCYTOGENES: NOT DETECTED
Methicillin resistance: NOT DETECTED
Neisseria meningitidis: NOT DETECTED
PROTEUS SPECIES: NOT DETECTED
Pseudomonas aeruginosa: NOT DETECTED
STAPHYLOCOCCUS AUREUS BCID: NOT DETECTED
STAPHYLOCOCCUS SPECIES: NOT DETECTED
Serratia marcescens: NOT DETECTED
Streptococcus agalactiae: NOT DETECTED
Streptococcus pneumoniae: NOT DETECTED
Streptococcus pyogenes: NOT DETECTED
Streptococcus species: NOT DETECTED
VANCOMYCIN RESISTANCE: NOT DETECTED

## 2016-05-04 LAB — URINE CULTURE: SPECIAL REQUESTS: NORMAL

## 2016-05-04 LAB — BASIC METABOLIC PANEL
ANION GAP: 7 (ref 5–15)
BUN: <5 mg/dL — ABNORMAL LOW (ref 6–20)
CALCIUM: 8.8 mg/dL — AB (ref 8.9–10.3)
CO2: 23 mmol/L (ref 22–32)
CREATININE: 0.36 mg/dL — AB (ref 0.44–1.00)
Chloride: 106 mmol/L (ref 101–111)
GFR calc non Af Amer: >60 mL/min (ref >60)
Glucose, Bld: 133 mg/dL — ABNORMAL HIGH (ref 65–99)
Potassium: 3.6 mmol/L (ref 3.5–5.1)
SODIUM: 136 mmol/L (ref 135–145)

## 2016-05-04 LAB — CBC WITH DIFFERENTIAL/PLATELET
BASOS ABS: 0 10*3/uL (ref 0–0.1)
Basophils Relative: 1 %
Eosinophils Absolute: 0 10*3/uL (ref 0–0.7)
Eosinophils Relative: 1 %
HEMATOCRIT: 33.6 % — AB (ref 35.0–47.0)
Hemoglobin: 11.8 g/dL — ABNORMAL LOW (ref 12.0–16.0)
LYMPHS PCT: 15 %
Lymphs Abs: 0.8 10*3/uL — ABNORMAL LOW (ref 1.0–3.6)
MCH: 32.5 pg (ref 26.0–34.0)
MCHC: 35.1 g/dL (ref 32.0–36.0)
MCV: 92.5 fL (ref 80.0–100.0)
MONO ABS: 0.5 10*3/uL (ref 0.2–0.9)
Monocytes Relative: 8 %
NEUTROS ABS: 4.1 10*3/uL (ref 1.4–6.5)
Neutrophils Relative %: 75 %
Platelets: 130 10*3/uL — ABNORMAL LOW (ref 150–440)
RBC: 3.63 MIL/uL — ABNORMAL LOW (ref 3.80–5.20)
RDW: 13.3 % (ref 11.5–14.5)
WBC: 5.4 10*3/uL (ref 3.6–11.0)

## 2016-05-04 LAB — CBC
HCT: 36.7 % (ref 35.0–47.0)
HEMOGLOBIN: 12.9 g/dL (ref 12.0–16.0)
MCH: 33 pg (ref 26.0–34.0)
MCHC: 35.2 g/dL (ref 32.0–36.0)
MCV: 93.7 fL (ref 80.0–100.0)
Platelets: 142 10*3/uL — ABNORMAL LOW (ref 150–440)
RBC: 3.92 MIL/uL (ref 3.80–5.20)
RDW: 13.4 % (ref 11.5–14.5)
WBC: 6.7 10*3/uL (ref 3.6–11.0)

## 2016-05-04 LAB — TYPE AND SCREEN
ABO/RH(D): O POS
Antibody Screen: NEGATIVE

## 2016-05-04 SURGERY — DILATION AND EVACUATION, UTERUS
Anesthesia: General | Site: Vagina | Wound class: Clean Contaminated

## 2016-05-04 MED ORDER — DEXTROSE 5 % IV SOLN
1.0000 g | Freq: Three times a day (TID) | INTRAVENOUS | Status: DC
  Administered 2016-05-04 – 2016-05-05 (×3): 1 g via INTRAVENOUS
  Filled 2016-05-04 (×5): qty 1

## 2016-05-04 MED ORDER — GLYCOPYRROLATE 0.2 MG/ML IJ SOLN
INTRAMUSCULAR | Status: DC | PRN
Start: 2016-05-04 — End: 2016-05-04
  Administered 2016-05-04: 0.2 mg via INTRAVENOUS

## 2016-05-04 MED ORDER — ONDANSETRON HCL 4 MG/2ML IJ SOLN
INTRAMUSCULAR | Status: DC | PRN
  Administered 2016-05-04: 4 mg via INTRAVENOUS

## 2016-05-04 MED ORDER — FENTANYL CITRATE (PF) 100 MCG/2ML IJ SOLN: 25.0000 ug | INTRAMUSCULAR | Status: DC | PRN

## 2016-05-04 MED ORDER — MIDAZOLAM HCL 2 MG/2ML IJ SOLN
INTRAMUSCULAR | Status: DC | PRN
  Administered 2016-05-04: 2 mg via INTRAVENOUS

## 2016-05-04 MED ORDER — FENTANYL CITRATE (PF) 100 MCG/2ML IJ SOLN
INTRAMUSCULAR | Status: DC | PRN
  Administered 2016-05-04 (×2): 50 ug via INTRAVENOUS

## 2016-05-04 MED ORDER — LIDOCAINE HCL (CARDIAC) 20 MG/ML IV SOLN
INTRAVENOUS | Status: DC | PRN
  Administered 2016-05-04: 80 mg via INTRAVENOUS

## 2016-05-04 MED ORDER — PROPOFOL 10 MG/ML IV BOLUS
INTRAVENOUS | Status: DC | PRN
  Administered 2016-05-04: 30 mg via INTRAVENOUS
  Administered 2016-05-04: 150 mg via INTRAVENOUS

## 2016-05-04 MED ORDER — METHYLERGONOVINE MALEATE 0.2 MG/ML IJ SOLN
INTRAMUSCULAR | Status: DC | PRN
  Administered 2016-05-04: 0.2 mg via INTRAMUSCULAR

## 2016-05-04 MED ORDER — LACTATED RINGERS IV SOLN
INTRAVENOUS | Status: DC | PRN
  Administered 2016-05-04: 17:00:00 via INTRAVENOUS

## 2016-05-04 MED ORDER — LACTATED RINGERS IV SOLN
INTRAVENOUS | Status: DC
  Administered 2016-05-05: 02:00:00 via INTRAVENOUS

## 2016-05-04 MED ORDER — METHYLERGONOVINE MALEATE 0.2 MG/ML IJ SOLN
INTRAMUSCULAR | Status: AC
  Filled 2016-05-04: qty 1

## 2016-05-04 MED ORDER — PROMETHAZINE HCL 25 MG/ML IJ SOLN: 6.2500 mg | INTRAMUSCULAR | Status: DC | PRN

## 2016-05-04 MED ORDER — OXYCODONE HCL 5 MG/5ML PO SOLN: 5.0000 mg | Freq: Once | ORAL | Status: DC | PRN

## 2016-05-04 MED ORDER — OXYCODONE HCL 5 MG PO TABS: 5.0000 mg | ORAL_TABLET | Freq: Once | ORAL | Status: DC | PRN

## 2016-05-04 MED ORDER — DEXAMETHASONE SODIUM PHOSPHATE 10 MG/ML IJ SOLN
INTRAMUSCULAR | Status: DC | PRN
  Administered 2016-05-04: 10 mg via INTRAVENOUS

## 2016-05-04 MED ORDER — MEPERIDINE HCL 25 MG/ML IJ SOLN: 6.2500 mg | INTRAMUSCULAR | Status: DC | PRN

## 2016-05-04 SURGICAL SUPPLY — 21 items
CATH ROBINSON RED A/P 16FR (CATHETERS) ×3 IMPLANT
FILTER UTR ASPR SPEC (MISCELLANEOUS) ×1 IMPLANT
FLTR UTR ASPR SPEC (MISCELLANEOUS) ×3
GLOVE BIO SURGEON STRL SZ 6.5 (GLOVE) ×4 IMPLANT
GLOVE BIO SURGEONS STRL SZ 6.5 (GLOVE) ×2
GLOVE INDICATOR 7.0 STRL GRN (GLOVE) ×6 IMPLANT
GOWN STRL REUS W/ TWL LRG LVL3 (GOWN DISPOSABLE) ×3 IMPLANT
GOWN STRL REUS W/TWL LRG LVL3 (GOWN DISPOSABLE) ×6
KIT BERKELEY 1ST TRIMESTER 3/8 (MISCELLANEOUS) ×3 IMPLANT
KIT RM TURNOVER CYSTO AR (KITS) ×3 IMPLANT
PACK DNC HYST (MISCELLANEOUS) ×3 IMPLANT
PAD OB MATERNITY 4.3X12.25 (PERSONAL CARE ITEMS) ×3 IMPLANT
PAD PREP 24X41 OB/GYN DISP (PERSONAL CARE ITEMS) ×3 IMPLANT
SET BERKELEY SUCTION TUBING (SUCTIONS) ×3 IMPLANT
TOWEL OR 17X26 4PK STRL BLUE (TOWEL DISPOSABLE) ×3 IMPLANT
VACURETTE 10 RIGID CVD (CANNULA) IMPLANT
VACURETTE 6 ASPIR F TIP BERK (CANNULA) ×3 IMPLANT
VACURETTE 7MM F TIP (CANNULA)
VACURETTE 7MM F TIP STRL (CANNULA) IMPLANT
VACURETTE 8 RIGID CVD (CANNULA) IMPLANT
VACURETTE 8MM F TIP (MISCELLANEOUS) ×3 IMPLANT

## 2016-05-04 NOTE — Progress Notes (Signed)
PHARMACY - PHYSICIAN COMMUNICATION CRITICAL VALUE ALERT - BLOOD CULTURE IDENTIFICATION (BCID)  Results for orders placed or performed during the hospital encounter of 05/02/16  Blood Culture ID Panel (Reflexed) (Collected: 05/03/2016  5:18 PM)  Result Value Ref Range   Enterococcus species NOT DETECTED NOT DETECTED   Vancomycin resistance NOT DETECTED NOT DETECTED   Listeria monocytogenes NOT DETECTED NOT DETECTED   Staphylococcus species NOT DETECTED NOT DETECTED   Staphylococcus aureus NOT DETECTED NOT DETECTED   Methicillin resistance NOT DETECTED NOT DETECTED   Streptococcus species NOT DETECTED NOT DETECTED   Streptococcus agalactiae NOT DETECTED NOT DETECTED   Streptococcus pneumoniae NOT DETECTED NOT DETECTED   Streptococcus pyogenes NOT DETECTED NOT DETECTED   Acinetobacter baumannii NOT DETECTED NOT DETECTED   Enterobacteriaceae species DETECTED (A) NOT DETECTED   Enterobacter cloacae complex NOT DETECTED NOT DETECTED   Escherichia coli DETECTED (A) NOT DETECTED   Klebsiella oxytoca NOT DETECTED NOT DETECTED   Klebsiella pneumoniae NOT DETECTED NOT DETECTED   Proteus species NOT DETECTED NOT DETECTED   Serratia marcescens NOT DETECTED NOT DETECTED   Carbapenem resistance NOT DETECTED NOT DETECTED   Haemophilus influenzae NOT DETECTED NOT DETECTED   Neisseria meningitidis NOT DETECTED NOT DETECTED   Pseudomonas aeruginosa NOT DETECTED NOT DETECTED   Candida albicans NOT DETECTED NOT DETECTED   Candida glabrata NOT DETECTED NOT DETECTED   Candida krusei NOT DETECTED NOT DETECTED   Candida parapsilosis NOT DETECTED NOT DETECTED   Candida tropicalis NOT DETECTED NOT DETECTED    Name of physician (or Provider) Contacted: [place]  Changes to prescribed antibiotics required: n/a  Patient is already on triple antibiotic for endometritis. Advised that we usually would recommend meropenem in the general adult inpatient population, but that in the setting of present treatment the  recommendation was less clear. MD will follow up with ID to develop a plan.  Caci Orren S 05/04/2016  7:28 AM

## 2016-05-04 NOTE — Op Note (Signed)
Operative Report Suction Dilation and Curettage   Indications: Uterine tenderness, vaginal bleeding, incomplete abortion   Pre-operative Diagnosis:  1. Suspected septic abortion 2. Incomplete abortion  Post-operative Diagnosis: same.  Procedure: 1. Suction D&C  Surgeon: Christeen Douglas, MD  Assistant(s):  None  Anesthesia: Monitored Local Anesthesia with Sedation  Anesthesiologist: Alver Fisher, MD Anesthesiologist: Alver Fisher, MD CRNA: Stormy Fabian, CRNA  Estimated Blood Loss:          Intraoperative medications: 0.2mg  IM methergine         Total IV Fluids:  Urine Output:         Specimens: Products of conception         Complications:  None; patient tolerated the procedure well.         Disposition: PACU - hemodynamically stable.         Condition: stable  Findings: Uterus measuring 8 weeks; normal cervix, vagina, perineum. Cervix open to 25mm prior to procedure  Indication for procedure/Consents: 35 y.o. [id number] admitted with pelvic pain and uterine tenderness, found to be pregnant with 44mm cystic sac in endometrium and betq quant of 7000, but no fetal pole and a very tender uterus on exam. Repeat TVUS with irregular gestational sac, and open cervix. Gram neg rods grew from blood cultures. She was consented for [facility] for septic ab, and as adnexa with resolving corpus luteum sac but no other signs of ectopic pregnancy, no laparoscopy required. She is continued on IV antibiotics.  Risks of surgery were discussed with the patient including but not limited to: bleeding which may require transfusion; infection which may require antibiotics; injury to uterus or surrounding organs; intrauterine scarring which may impair future fertility; need for additional procedures including laparotomy or laparoscopy; and other postoperative/anesthesia complications. Written informed consent was obtained.    Procedure Details:   Re-dicussed procedure with patient.  She was then taken to the operating room where general anesthesia was administered and was found to be adequate.  After a formal and adequate timeout was performed, she was placed in the dorsal lithotomy position and examined with the above findings. She was then prepped and draped in the sterile manner.   Her bladder was catheterized for an estimated amount of clear, yellow urine. A speculum was then placed in the patient's vagina and a single tooth tenaculum was applied to the anterior lip of the cervix.    No uterine sounding was performed on this pregnant uterus. Her cervix did not need to be serially dilated to accommodate a 8 sized flexible suction curette.  A sharp curettage was then gently performed until there was a gritty texture in all four quadrants.  The tenaculum was removed from the anterior lip of the cervix and the vaginal speculum was removed after noting good hemostasis. The patient tolerated the procedure well and was taken to the recovery area awake, extubated and in stable condition.  She was given 0.2mg  methergine IM in the OR.  The patient will return to her hospital bed for overnight monitoring with iv abx. She will be prescribed Percocet, Ibuprofen and Colace.  She will follow up in the clinic in two weeks for postoperative evaluation.

## 2016-05-04 NOTE — Anesthesia Postprocedure Evaluation (Signed)
Anesthesia Post Note  Patient: Christine Bowers  Procedure(s) Performed: Procedure(s) (LRB): DILATATION AND EVACUATION (N/A)  Patient location during evaluation: PACU Anesthesia Type: General Level of consciousness: awake and alert and oriented Pain management: pain level controlled Vital Signs Assessment: post-procedure vital signs reviewed and stable Respiratory status: spontaneous breathing, nonlabored ventilation and respiratory function stable Cardiovascular status: blood pressure returned to baseline and stable Postop Assessment: no signs of nausea or vomiting Anesthetic complications: no    Last Vitals:  Vitals:   05/04/16 1807 05/04/16 1906  BP: (!) 93/50 (!) 101/42  Pulse: 60 68  Resp: 16   Temp: 36.6 C 37 C    Last Pain:  Vitals:   05/04/16 1906  TempSrc: Oral  PainSc:                  Lisa Blakeman

## 2016-05-04 NOTE — Anesthesia Procedure Notes (Signed)
Procedure Name: LMA Insertion Date/Time: 05/04/2016 4:44 PM Performed by: Stormy Fabian Pre-anesthesia Checklist: Patient identified, Patient being monitored, Timeout performed, Emergency Drugs available and Suction available Patient Re-evaluated:Patient Re-evaluated prior to inductionOxygen Delivery Method: Circle system utilized Preoxygenation: Pre-oxygenation with 100% oxygen Intubation Type: IV induction Ventilation: Mask ventilation without difficulty LMA: LMA inserted LMA Size: 4.5 Tube type: Oral Number of attempts: 1 Placement Confirmation: positive ETCO2 and breath sounds checked- equal and bilateral Tube secured with: Tape Dental Injury: Teeth and Oropharynx as per pre-operative assessment

## 2016-05-04 NOTE — Transfer of Care (Signed)
Immediate Anesthesia Transfer of Care Note  Patient: Christine Bowers  Procedure(s) Performed: Procedure(s): DILATATION AND EVACUATION (N/A)  Patient Location: PACU  Anesthesia Type:General  Level of Consciousness: sedated  Airway & Oxygen Therapy: Patient Spontanous Breathing and Patient connected to face mask oxygen  Post-op Assessment: Report given to RN and Post -op Vital signs reviewed and stable  Post vital signs: Reviewed and stable  Last Vitals:  Vitals:   05/04/16 1602 05/04/16 1719  BP: (!) 104/51 (!) 101/59  Pulse: 82 70  Resp: 18 17  Temp: 37.1 C 36.7 C    Complications: No apparent anesthesia complications

## 2016-05-04 NOTE — Anesthesia Preprocedure Evaluation (Signed)
Anesthesia Evaluation  Patient identified by MRN, date of birth, ID band Patient awake    Reviewed: Allergy & Precautions, NPO status , Patient's Chart, lab work & pertinent test results  History of Anesthesia Complications Negative for: history of anesthetic complications  Airway Mallampati: II  TM Distance: >3 FB Neck ROM: Full    Dental no notable dental hx.    Pulmonary asthma (on daily controller) , Current Smoker,    breath sounds clear to auscultation- rhonchi (-) wheezing      Cardiovascular Exercise Tolerance: Good (-) hypertension(-) CAD negative cardio ROS   Rhythm:Regular Rate:Normal - Systolic murmurs and - Diastolic murmurs    Neuro/Psych negative neurological ROS  negative psych ROS   GI/Hepatic negative GI ROS, Neg liver ROS,   Endo/Other  negative endocrine ROSneg diabetes  Renal/GU negative Renal ROS     Musculoskeletal negative musculoskeletal ROS (+)   Abdominal (+) - obese,   Peds  Hematology negative hematology ROS (+)   Anesthesia Other Findings   Reproductive/Obstetrics                             Anesthesia Physical Anesthesia Plan  ASA: II  Anesthesia Plan: General   Post-op Pain Management:    Induction: Intravenous  Airway Management Planned: LMA  Additional Equipment:   Intra-op Plan:   Post-operative Plan:   Informed Consent: I have reviewed the patients History and Physical, chart, labs and discussed the procedure including the risks, benefits and alternatives for the proposed anesthesia with the patient or authorized representative who has indicated his/her understanding and acceptance.   Dental advisory given  Plan Discussed with: CRNA and Anesthesiologist  Anesthesia Plan Comments:         Anesthesia Quick Evaluation

## 2016-05-04 NOTE — Progress Notes (Signed)
Subjective:  Patient reports increased cramping, no anorexia, improved fever and chills  Objective: I have reviewed patient's vital signs, intake and output, medications, labs and microbiology.  Blood pressure (!) 105/52, pulse 88, temperature 98.9 F (37.2 C), temperature source Oral, resp. rate 18, height 5\' 5"  (1.651 m), weight 160 lb (72.6 kg), last menstrual period 03/10/2016, SpO2 96 %.  General: alert, cooperative and appears stated age Resp: clear to auscultation bilaterally Cardio: regular rate and rhythm, S1, S2 normal, no murmur, click, rub or gallop GI: soft,; bowel sounds normal; no masses,  no organomegaly, TTP LLQ>RLQ, but increased cramping Vaginal bleeding: Increased  Assessment/Plan: HD#2 for suspected septic abortion, but improvement overall with IV genta/clinda. Still with pregnancy of unknown location  - blood culture x1 returned with gram negative rods, organism pending. Consult to ID recommended cefepime +flagyl. Will call her with organism results tomorrow to narrow scope of antibiotics. - Will get u/s and repeat beta now to confirm pelvic findings. Plan for [facility], dx laparoscopy today. -    LOS: 0 days    Christine Bowers 05/04/2016, 9:01 AM

## 2016-05-04 NOTE — Progress Notes (Signed)
S: +Abdominal pain  O:  Vitals:   05/04/16 0712 05/04/16 1228  BP: (!) 105/52 110/69  Pulse: 88 74  Resp: 18 20  Temp: 98.9 F (37.2 C) 98.7 F (37.1 C)   Hemoglobin  Date Value Ref Range Status  05/04/2016 11.8 (L) 12.0 - 16.0 g/dL Final   HGB  Date Value Ref Range Status  03/09/2013 9.5 (L) 12.0 - 16.0 g/dL Final   WBC  Date Value Ref Range Status  05/04/2016 5.4 3.6 - 11.0 K/uL Final    Radiology: CLINICAL DATA:  Early pregnancy.  Vaginal bleeding. EXAM: OBSTETRIC <14 WK [place] AND TRANSVAGINAL OB US TECHNIQUE: Both transabdominal and transvaginal ultrasound examinations were performed for complete evaluation of the gestation as well as the maternal uterus, adnexal regions, and pelvic cul-de-sac. Transvaginal technique was performed to assess early pregnancy. COMPARISON:  Ultrasound dated 05/02/2016 FINDINGS: Intrauterine gestational sac: Single, slightly irregular in contour Yolk sac:  No Embryo:  No Cardiac Activity: No MSD: 12  mm   6 w   0  d [place] EDC: 12/28/2016 Subchorionic hemorrhage:  Small, unchanged Maternal uterus/adnexae: 1.8 cm slightly hypoechoic area in the right ovary, consistent with a corpus luteum. The endometrial contents appeared in motion during the exam, worrisome for a spontaneous abortion in progress. IMPRESSION: Intrauterine gestational sac without a fetal pole. Unchanged small subchorionic hemorrhage. Motion of the endometrial contents suggests impending spontaneous abortion. Electronically Signed   By: Francene Boyers M.D.   On: 05/04/2016 12:03  A: Septic abortion, +gram negative bacteremia  P: U/S with resolved corpus luetum cyst Endometrial contents without viable IUP - D&C today add on. No laparoscopy with this resolving u/s - Change abx to cefepime and flagyl - repeat beta pending

## 2016-05-04 NOTE — Progress Notes (Signed)
Pt has low platelets at 127.

## 2016-05-05 LAB — BETA HCG QUANT (REF LAB): BETA HCG, TUMOR MARKER: 3430 m[IU]/mL

## 2016-05-05 MED ORDER — CIPROFLOXACIN HCL 500 MG PO TABS: 500.0000 mg | ORAL_TABLET | Freq: Two times a day (BID) | ORAL | 0 refills | Status: DC

## 2016-05-05 MED ORDER — METRONIDAZOLE 500 MG PO TABS: 500.0000 mg | ORAL_TABLET | Freq: Two times a day (BID) | ORAL | 0 refills | Status: AC

## 2016-05-05 MED ORDER — DOCUSATE SODIUM 100 MG PO CAPS: 100.0000 mg | ORAL_CAPSULE | Freq: Two times a day (BID) | ORAL | 0 refills | Status: DC

## 2016-05-05 NOTE — Discharge Summary (Signed)
Physician Discharge Summary  Patient ID: Christine Bowers MRN: [id number] DOB/AGE: 34-29-1982 34 y.o.  Admit date: 05/02/2016 Discharge date: 05/05/2016  Admission Diagnoses:  Discharge Diagnoses:  Active Problems:   Endometritis  Discharged Condition: good  Hospital Course: Pt admitted with fever, tachycardia, chills, bacteria vaginosis, leukocytosis and a suspected septic abortion. She was started on Flagyl in the ER, and gent/clinda on admission. Her WBC normalized and she was afebrile by HD#2. Blood cultures grew out E.coli, and after consultation with ID, she was switched to cefepime and kept on Flagyl. In light of bacteremia, a repeat u/s confirmed no intrauterine pregnancy and dropping beta quant, but she continued to have significant uterine tenderness and minimal bleeding. The decision was made to empty uterine contents and she did have an uncomplicated D&C. By the next morning her pain was resolved except for occasional cramping and she was stable for discharge.   She was also noted to have thrombocytopenia, which is likely dilutional, at discharge.  ID was again consulted for d/c antibiotics. Susceptibilities are not back, and I will send her home on cipro  q12hr x7days and metro 500q12hrs x4 more day to complete 7 day course. Will check susceptibilities and change abx as outpatient if needed.  Consults: ID  Significant Diagnostic Studies:  WBC  Date Value Ref Range Status  05/04/2016 6.7 3.6 - 11.0 K/uL Final  05/04/2016 5.4 3.6 - 11.0 K/uL Final  05/03/2016 6.9 3.6 - 11.0 K/uL Final  05/02/2016 12.9 (H) 3.6 - 11.0 K/uL Final  07/13/2015 18.7 (H) 3.6 - 11.0 K/uL Final   Beta quant on admission: 7,179 Beta quant prior to D&C: 3,430  Bllood culture: Lab Results  Component Value Date   CULT ESCHERICHIA COLI (A) 05/03/2016     Treatments: IV hydration, antibiotics: gentamycin, clindamycin, metronidazole and cefepime, and surgery: Suction D&C  Discharge  Exam: Blood pressure (!) 98/53, pulse (!) 55, temperature 97.8 F (36.6 C), temperature source Oral, resp. rate 18, height  (1.651 m), weight 160 lb (72.6 kg), last menstrual period 03/10/2016, SpO2 100 %. General appearance: alert, cooperative and appears stated age Resp: clear to auscultation bilaterally and normal percussion bilaterally Chest wall: no tenderness Cardio: regular rate and rhythm, S1, S2 normal, no murmur, click, rub or gallop GI: soft, non-tender; bowel sounds normal; no masses,  no organomegaly Extremities: extremities normal, atraumatic, no cyanosis or edema Pulses: 2+ and symmetric Skin: Skin color, texture, turgor normal. No rashes or lesions  Disposition: 01-Home or Self Care     Medication List    STOP taking these medications   cephALEXin 500 MG capsule Commonly known as:  KEFLEX     TAKE these medications   albuterol 108 (90 Base) MCG/ACT inhaler Commonly known as:  PROVENTIL HFA;VENTOLIN HFA Inhale 2 puffs into the lungs every 6 (six) hours as needed for wheezing or shortness of breath.   ciprofloxacin 500 MG tablet Commonly known as:  CIPRO Take 1 tablet (500 mg total) by mouth 2 (two) times daily.   docusate sodium 100 MG capsule Commonly known as:  COLACE Take 1 capsule (100 mg total) by mouth 2 (two) times daily. To keep stools soft   metroNIDAZOLE 500 MG tablet Commonly known as:  FLAGYL Take 500 mg by mouth 2 (two) times daily. What changed:  Another medication with the same name was added. Make sure you understand how and when to take each.   metroNIDAZOLE 500 MG tablet Commonly known as:  FLAGYL Take 1 tablet (500  mg total) by mouth 2 (two) times daily. What changed:  You were already taking a medication with the same name, and this prescription was added. Make sure you understand how and when to take each.   ondansetron 4 MG tablet Commonly known as:  ZOFRAN Take 4-8 mg by mouth every 8 (eight) hours as needed for nausea or  vomiting. What changed:  Another medication with the same name was added. Make sure you understand how and when to take each.   ondansetron 4 MG tablet Commonly known as:  ZOFRAN Take 1-2 tabs by mouth every 8 hours as needed for nausea/vomiting What changed:  You were already taking a medication with the same name, and this prescription was added. Make sure you understand how and when to take each.   potassium chloride SA 20 MEQ tablet Commonly known as:  K-DUR,KLOR-CON Take 20 mEq by mouth daily. What changed:  Another medication with the same name was added. Make sure you understand how and when to take each.   potassium chloride SA 20 MEQ tablet Commonly known as:  KLOR-CON M20 Take 1 tablet (20 mEq total) by mouth daily. What changed:  You were already taking a medication with the same name, and this prescription was added. Make sure you understand how and when to take each.      She will f/u in the office in 1-2 weeks for evaluation of her infection status, and for postop care  Signed: Christeen Douglas 05/05/2016, 11:58 AM

## 2016-05-05 NOTE — Progress Notes (Signed)
Patient discharged home. Discharge instructions, prescriptions and follow up appointment given to and reviewed with patient. Patient verbalized understanding. Escorted out via wheelchair by Annette Thompson, NT.  

## 2016-05-06 LAB — CULTURE, BLOOD (SINGLE)

## 2016-05-06 NOTE — Progress Notes (Addendum)
Patient ID: Christine Bowers, female   DOB: November 20, 1980, 35 y.o.   MRN: [id number]  Pt discharged yesterday on cipro and flagyl after successful D&C, in much improved condition.  Bacteremia with blood culture returned Ecoli resistant to cipro, gent bactrim, ampicillin and Augmentin. Will check with Infectious disease on call re: treatment options.  Sensitive to cefazolin, cefepime, ceftazidime, ceftriaxone, extended ESBL, imipenem, pip/tazo.  Her clinical picture is discordant with these results. Afebrile, pain resolved, WBC dropped to normal range while in house.

## 2016-05-07 ENCOUNTER — Encounter: Payer: Self-pay | Admitting: Obstetrics and Gynecology

## 2016-05-07 NOTE — Patient Instructions (Addendum)
DOCUMENTATION: CLINICAL  Results of blood culture of E.coli returned resistance to cipro, which is the empiric abx I sent her home on. I have called and left a message at the number in her chart twice. I would like to give her keflex 500mg  four times daily for 7 days instead of cipro. We will continue to attempt to contact her. At discharge over the weekend I asked her to schedule an appointment with me in 1 week by calling this morning, and will try to f/u with her if she calls today.

## 2016-05-08 LAB — SURGICAL PATHOLOGY

## 2016-10-02 ENCOUNTER — Emergency Department
Admission: EM | Admit: 2016-10-02 | Discharge: 2016-10-02 | Disposition: A | Discharge status: Home / Self Care | Payer: 59 | Attending: Emergency Medicine | Admitting: Emergency Medicine

## 2016-10-02 ENCOUNTER — Encounter: Payer: Self-pay | Admitting: Emergency Medicine

## 2016-10-02 ENCOUNTER — Emergency Department: Payer: 59

## 2016-10-02 DIAGNOSIS — O209 Hemorrhage in early pregnancy, unspecified: Secondary | ICD-10-CM | POA: Diagnosis present

## 2016-10-02 DIAGNOSIS — O99331 Smoking (tobacco) complicating pregnancy, first trimester: Secondary | ICD-10-CM | POA: Insufficient documentation

## 2016-10-02 DIAGNOSIS — F17210 Nicotine dependence, cigarettes, uncomplicated: Secondary | ICD-10-CM | POA: Diagnosis not present

## 2016-10-02 DIAGNOSIS — J45909 Unspecified asthma, uncomplicated: Secondary | ICD-10-CM | POA: Diagnosis not present

## 2016-10-02 DIAGNOSIS — O200 Threatened abortion: Secondary | ICD-10-CM | POA: Insufficient documentation

## 2016-10-02 DIAGNOSIS — Z3A09 9 weeks gestation of pregnancy: Secondary | ICD-10-CM | POA: Diagnosis not present

## 2016-10-02 LAB — CBC WITH DIFFERENTIAL/PLATELET
BASOS ABS: 0.1 10*3/uL (ref 0–0.1)
BASOS PCT: 1 %
EOS ABS: 0.2 10*3/uL (ref 0–0.7)
Eosinophils Relative: 3 %
HEMATOCRIT: 36.6 % (ref 35.0–47.0)
HEMOGLOBIN: 12.8 g/dL (ref 12.0–16.0)
Lymphocytes Relative: 31 %
Lymphs Abs: 2.3 10*3/uL (ref 1.0–3.6)
MCH: 33.2 pg (ref 26.0–34.0)
MCHC: 35 g/dL (ref 32.0–36.0)
MCV: 94.7 fL (ref 80.0–100.0)
MONOS PCT: 9 %
Monocytes Absolute: 0.7 10*3/uL (ref 0.2–0.9)
NEUTROS ABS: 4.1 10*3/uL (ref 1.4–6.5)
NEUTROS PCT: 56 %
Platelets: 291 10*3/uL (ref 150–440)
RBC: 3.86 MIL/uL (ref 3.80–5.20)
RDW: 13.3 % (ref 11.5–14.5)
WBC: 7.4 10*3/uL (ref 3.6–11.0)

## 2016-10-02 LAB — COMPREHENSIVE METABOLIC PANEL
ALBUMIN: 4 g/dL (ref 3.5–5.0)
ALK PHOS: 30 U/L — AB (ref 38–126)
ALT: 11 U/L — AB (ref 14–54)
ANION GAP: 6 (ref 5–15)
AST: 19 U/L (ref 15–41)
BILIRUBIN TOTAL: 0.5 mg/dL (ref 0.3–1.2)
BUN: 18 mg/dL (ref 6–20)
CALCIUM: 8.9 mg/dL (ref 8.9–10.3)
CO2: 24 mmol/L (ref 22–32)
Chloride: 102 mmol/L (ref 101–111)
Creatinine, Ser: 0.55 mg/dL (ref 0.44–1.00)
GFR calc Af Amer: >60 mL/min (ref >60)
GFR calc non Af Amer: >60 mL/min (ref >60)
GLUCOSE: 76 mg/dL (ref 65–99)
Potassium: 3.3 mmol/L — ABNORMAL LOW (ref 3.5–5.1)
SODIUM: 132 mmol/L — AB (ref 135–145)
TOTAL PROTEIN: 7 g/dL (ref 6.5–8.1)

## 2016-10-02 LAB — URINALYSIS, COMPLETE (UACMP) WITH MICROSCOPIC
BACTERIA UA: NONE SEEN
BILIRUBIN URINE: NEGATIVE
Glucose, UA: NEGATIVE mg/dL
Hgb urine dipstick: NEGATIVE
KETONES UR: NEGATIVE mg/dL
LEUKOCYTES UA: NEGATIVE
Nitrite: NEGATIVE
PH: 5 (ref 5.0–8.0)
Protein, ur: NEGATIVE mg/dL
RBC / HPF: NONE SEEN RBC/hpf (ref 0–5)
SPECIFIC GRAVITY, URINE: 1.028 (ref 1.005–1.030)
WBC, UA: NONE SEEN WBC/hpf (ref 0–5)

## 2016-10-02 LAB — POCT PREGNANCY, URINE: PREG TEST UR: POSITIVE — AB

## 2016-10-02 LAB — HCG, QUANTITATIVE, PREGNANCY: hCG, Beta Chain, Quant, S: 93221 m[IU]/mL — ABNORMAL HIGH (ref <5)

## 2016-10-02 NOTE — ED Triage Notes (Signed)
Pt reports she is about [redacted] weeks pregnant and started having vaginal bleeding yesterday, reports spotting. Pt's LMP 08/08/16.

## 2016-10-02 NOTE — ED Notes (Signed)
Pt back from ultrasound.

## 2016-10-02 NOTE — ED Provider Notes (Signed)
[facility] Emergency Department Provider Note        Time seen: ----------------------------------------- 4:59 PM on 10/02/2016 -----------------------------------------    I have reviewed the triage vital signs and the nursing notes.   HISTORY  Chief Complaint Vaginal Bleeding    HPI Christine Bowers is a 35 y.o. female who presents to ER for vaginal bleeding. Patient states she's been spotting for several days and she is around [redacted] weeks pregnant. She was having heavier bleeding yesterday but currently only spotting. She's not had any abdominal pain. She is G6, P3, Ab2. She denies fevers, chills or other complaints.   Past Medical History:  Diagnosis Date  . Asthma   . Miscarriage     Patient Active Problem List   Diagnosis Date Noted  . Endometritis 05/03/2016    Past Surgical History:  Procedure Laterality Date  . DILATION AND CURETTAGE OF UTERUS    . DILATION AND EVACUATION N/A 05/04/2016   Procedure: DILATATION AND EVACUATION;  Surgeon: Christeen DouglasBethany Beasley, MD;  Location: [facility] ORS;  Service: Gynecology;  Laterality: N/A;    Allergies Aspirin and Ibuprofen  Social History Social History  Substance Use Topics  . Smoking status: Current Every Day Smoker    Packs/day: 0.50    Types: Cigarettes  . Smokeless tobacco: Never Used  . Alcohol use 0.6 oz/week    1 Standard drinks or equivalent per week    Review of Systems Constitutional: Negative for fever. Cardiovascular: Negative for chest pain. Respiratory: Negative for shortness of breath. Gastrointestinal: Negative for abdominal pain, vomiting and diarrhea. Genitourinary: Negative for dysuria.Positive for vaginal bleeding Musculoskeletal: Negative for back pain. Skin: Negative for rash. Neurological: Negative for headaches, focal weakness or numbness.  10-point ROS otherwise negative.  ____________________________________________   PHYSICAL EXAM:  VITAL SIGNS: ED Triage Vitals  [10/02/16 1635]  Enc Vitals Group     BP (!) 119/59     Pulse Rate 88     Resp 17     Temp 97.7 F (36.5 C)     Temp Source Oral     SpO2 100 %     Weight 165 lb (74.8 kg)     Height 5\' 4"  (1.626 m)     Head Circumference      Peak Flow      Pain Score 0     Pain Loc      Pain Edu?      Excl. in GC?     Constitutional: Alert and oriented. Well appearing and in no distress. Eyes: Conjunctivae are normal. PERRL. Normal extraocular movements. ENT   Head: Normocephalic and atraumatic.   Nose: No congestion/rhinnorhea.   Mouth/Throat: Mucous membranes are moist.   Neck: No stridor. Cardiovascular: Normal rate, regular rhythm. No murmurs, rubs, or gallops. Respiratory: Normal respiratory effort without tachypnea nor retractions. Breath sounds are clear and equal bilaterally. No wheezes/rales/rhonchi. Gastrointestinal: Soft and nontender. Normal bowel sounds Musculoskeletal: Nontender with normal range of motion in all extremities. No lower extremity tenderness nor edema. Neurologic:  Normal speech and language. No gross focal neurologic deficits are appreciated.  Skin:  Skin is warm, dry and intact. No rash noted. Psychiatric: Mood and affect are normal. Speech and behavior are normal.  ____________________________________________  ED COURSE:  Pertinent labs & imaging results that were available during my care of the patient were reviewed by me and considered in my medical decision making (see chart for details). Clinical Course   Patient is in no distress, we will  assess with labs and ultrasound.  Procedures ____________________________________________   LABS (pertinent positives/negatives)  Labs Reviewed  HCG, QUANTITATIVE, PREGNANCY - Abnormal; Notable for the following:       Result Value   hCG, Beta Chain, Quant, S 93,221 (*)    All other components within normal limits  COMPREHENSIVE METABOLIC PANEL - Abnormal; Notable for the following:    Sodium 132  (*)    Potassium 3.3 (*)    ALT 11 (*)    Alkaline Phosphatase 30 (*)    All other components within normal limits  URINALYSIS, COMPLETE (UACMP) WITH MICROSCOPIC - Abnormal; Notable for the following:    Color, Urine YELLOW (*)    APPearance CLEAR (*)    Squamous Epithelial / LPF 0-5 (*)    All other components within normal limits  POCT PREGNANCY, URINE - Abnormal; Notable for the following:    Preg Test, Ur POSITIVE (*)    All other components within normal limits  CBC WITH DIFFERENTIAL/PLATELET  POC URINE PREG, ED    RADIOLOGY  Pregnancy ultrasound IMPRESSION: 1. Single living intrauterine gestation. The estimated gestational age is 6 weeks and 5 days. 2. Small amount of free fluid noted within the pelvis. 3. Left ovary cyst measures 3 cm   Electronically Signed ____________________________________________  FINAL ASSESSMENT AND PLAN  Threatened miscarriage  Plan: Patient with labs and imaging as dictated above. Patient is in no acute distress, likely threatened miscarriage. I will advise follow-up with her OB/GYN doctor for recheck.   Emily FilbertWilliams, Jonathan E, MD   Note: This dictation was prepared with Dragon dictation. Any transcriptional errors that result from this process are unintentional    Emily FilbertJonathan E Williams, MD 10/02/16 [phone]

## 2016-10-02 NOTE — ED Notes (Signed)
Positive pregnancy test X 1 week ago. Spotting that began yesterday, no pain. Pt alert and oriented X4, active, cooperative, pt in NAD. RR even and unlabored, color WNL.

## 2016-10-08 NOTE — L&D Delivery Note (Signed)
Delivery Note At 10:42 AM a viable female was delivered via Vaginal, Spontaneous Delivery, patient delivered on toilet prior to my arrival.  APGAR: 8, 9; weight 2870 5lbs 6oz.   Placenta status:spontaneous, intact  Cord: 3VC  with the following complications: precipitous delivery .  Cord pH: N/A  Anesthesia:  none Episiotomy: None Lacerations: None Suture Repair: none Est. Blood Loss (mL):  300mL  Mom to postpartum.  Baby to Couplet care / Skin to Skin.  Christine Bowers 05/20/2017, 10:50 AM

## 2016-10-08 NOTE — L&D Delivery Note (Signed)
RN San Jetty( Latesia Norrington Neese ) came in to check on patient and noticed she was up to the bathroom, RN entered bathroom and patient expressed that her water had just broke. RN instructed patient that we needed to get her back to bed. Patient was unable to stand due to newborn crowning. Shary KeyGale Kirby, RN entered the bathroom at this time and delivered the baby in the toilet. Newborn did not touch the water. San JettyLaurie Neese, RN dried the infant and called transition to the room. Infant cried upon delivery and tolerated delivery well. Infant was handed to transition after the cord was clamped by Shary KeyGale Kirby, RN. Patient was then helped back to bed and Dr Bonney AidStaebler delivered the placenta. Fundus was firm, 2 below umbilical, small amount of rubra, and no clots. Perineum cleaned and ice pack applied.

## 2016-10-10 ENCOUNTER — Ambulatory Visit (INDEPENDENT_AMBULATORY_CARE_PROVIDER_SITE_OTHER): Payer: 59 | Admitting: Obstetrics and Gynecology

## 2016-10-10 ENCOUNTER — Other Ambulatory Visit: Payer: Self-pay | Admitting: Obstetrics and Gynecology

## 2016-10-10 ENCOUNTER — Other Ambulatory Visit (INDEPENDENT_AMBULATORY_CARE_PROVIDER_SITE_OTHER): Payer: 59

## 2016-10-10 ENCOUNTER — Encounter: Payer: Self-pay | Admitting: Obstetrics and Gynecology

## 2016-10-10 VITALS — BP 106/56 | HR 82 | Ht 64.0 in | Wt 162.0 lb

## 2016-10-10 DIAGNOSIS — O200 Threatened abortion: Secondary | ICD-10-CM

## 2016-10-10 DIAGNOSIS — O26841 Uterine size-date discrepancy, first trimester: Secondary | ICD-10-CM

## 2016-10-10 NOTE — Progress Notes (Signed)
GYNECOLOGY PROGRESS NOTE  Subjective:    Patient ID: Christine SimmondsCynthia M Bowers, female    DOB: 1981-01-29, 36 y.o.   MRN: [id number]  HPI  Patient is a 36 y.o. [id number] female who presents for follow up from Emergency Room visit for threatened miscarriage. Patient was seen in the Emergency Room on 10/02/2016 at ~ [redacted] weeks gestation by LMP with complaints of vaginal bleeding and spotting.  At the time she denied abdominal pain, fevers chills.  Today notes that she has only had spotting since her ER visit. Still denies pain or other symptoms.    Past Medical History:  Diagnosis Date  . Asthma   . Miscarriage     Family History  Problem Relation Age of Onset  . Thyroid disease Mother   . Hypertension Father     Past Surgical History:  Procedure Laterality Date  . DILATION AND CURETTAGE OF UTERUS    . DILATION AND EVACUATION N/A 05/04/2016   Procedure: DILATATION AND EVACUATION;  Surgeon: Christeen DouglasBethany Beasley, MD;  Location: [facility]S;  Service: Gynecology;  Laterality: N/A;    Social History   Social History  . Marital status: Single    Spouse name: N/A  . Number of children: N/A  . Years of education: N/A   Occupational History  .  [employer]   Social History Main Topics  . Smoking status: Current Some Day Smoker    Packs/day: 0.50    Years: 20.00    Types: Cigarettes  . Smokeless tobacco: Never Used  . Alcohol use No  . Drug use: No  . Sexual activity: Yes    Partners: Female    Birth control/ protection: None   Other Topics Concern  . Not on file   Social History Narrative  . No narrative on file    No current outpatient prescriptions on file prior to visit.   No current facility-administered medications on file prior to visit.     Allergies  Allergen Reactions  . Aspirin Other (See Comments)  . Ibuprofen Other (See Comments)    Review of Systems Pertinent items noted in HPI and remainder of comprehensive ROS otherwise negative.   Objective:   Blood  pressure (!) 106/56, pulse 82, height 5\' 4"  (1.626 m), weight 162 lb (73.5 kg), last menstrual period 08/08/2016.   Body mass index is 27.81 kg/m.  General appearance: alert and no distress Abdomen: soft, non-tender; bowel sounds normal; no masses,  no organomegaly Pelvic: external genitalia normal, rectovaginal septum normal.  Vagina normal without discharge or lesions.  Cervix normal appearing, closed, no lesions and no motion tenderness.  Uterus mobile, nontender, normal shape and size.  Adnexae non-palpable, nontender bilaterally.  Extremities: extremities normal, atraumatic, no cyanosis or edema Neurologic: Grossly normal    Labs:  Results for orders placed or performed during the hospital encounter of 10/02/16  hCG, quantitative, pregnancy  Result Value Ref Range   hCG, Beta Chain, Quant, S 93,221 (H) <5 mIU/mL  CBC with Differential/Platelet  Result Value Ref Range   WBC 7.4 3.6 - 11.0 K/uL   RBC 3.86 3.80 - 5.20 MIL/uL   Hemoglobin 12.8 12.0 - 16.0 g/dL   HCT 62.936.6 52.835.0 - 41.347.0 %   MCV 94.7 80.0 - 100.0 fL   MCH 33.2 26.0 - 34.0 pg   MCHC 35.0 32.0 - 36.0 g/dL   RDW 24.413.3 01.011.5 - 27.214.5 %   Platelets 291 150 - 440 K/uL   Neutrophils Relative % 56 %  Neutro Abs 4.1 1.4 - 6.5 K/uL   Lymphocytes Relative 31 %   Lymphs Abs 2.3 1.0 - 3.6 K/uL   Monocytes Relative 9 %   Monocytes Absolute 0.7 0.2 - 0.9 K/uL   Eosinophils Relative 3 %   Eosinophils Absolute 0.2 0 - 0.7 K/uL   Basophils Relative 1 %   Basophils Absolute 0.1 0 - 0.1 K/uL  Comprehensive metabolic panel  Result Value Ref Range   Sodium 132 (L) 135 - 145 mmol/L   Potassium 3.3 (L) 3.5 - 5.1 mmol/L   Chloride 102 101 - 111 mmol/L   CO2 24 22 - 32 mmol/L   Glucose, Bld 76 65 - 99 mg/dL   BUN 18 6 - 20 mg/dL   Creatinine, Ser 3.01 0.44 - 1.00 mg/dL   Calcium 8.9 8.9 - 60.1 mg/dL   Total Protein 7.0 6.5 - 8.1 g/dL   Albumin 4.0 3.5 - 5.0 g/dL   AST 19 15 - 41 U/L   ALT 11 (L) 14 - 54 U/L   Alkaline Phosphatase  30 (L) 38 - 126 U/L   Total Bilirubin 0.5 0.3 - 1.2 mg/dL   GFR calc non Af Amer >60 >60 mL/min   GFR calc Af Amer >60 >60 mL/min   Anion gap 6 5 - 15  Urinalysis, Complete w Microscopic  Result Value Ref Range   Color, Urine YELLOW (A) YELLOW   APPearance CLEAR (A) CLEAR   Specific Gravity, Urine 1.028 1.005 - 1.030   pH 5.0 5.0 - 8.0   Glucose, UA NEGATIVE NEGATIVE mg/dL   Hgb urine dipstick NEGATIVE NEGATIVE   Bilirubin Urine NEGATIVE NEGATIVE   Ketones, ur NEGATIVE NEGATIVE mg/dL   Protein, ur NEGATIVE NEGATIVE mg/dL   Nitrite NEGATIVE NEGATIVE   Leukocytes, UA NEGATIVE NEGATIVE   RBC / HPF NONE SEEN 0 - 5 RBC/hpf   WBC, UA NONE SEEN 0 - 5 WBC/hpf   Bacteria, UA NONE SEEN NONE SEEN   Squamous Epithelial / LPF 0-5 (A) NONE SEEN   Mucous PRESENT   Pregnancy, urine POC  Result Value Ref Range   Preg Test, Ur POSITIVE (A) NEGATIVE   Lab Results  Component Value Date   ABORH O POS 05/04/2016     Imaging:  CLINICAL DATA:  Bleeding.  Eight weeks pregnant  EXAM: OBSTETRIC <14 WK [place] AND TRANSVAGINAL OB US  TECHNIQUE: Both transabdominal and transvaginal ultrasound examinations were performed for complete evaluation of the gestation as well as the maternal uterus, adnexal regions, and pelvic cul-de-sac. Transvaginal technique was performed to assess early pregnancy.  COMPARISON:  None.  FINDINGS: Intrauterine gestational sac: Single  Yolk sac:  Visualized.  Embryo:  Visualized.  Cardiac Activity: Visualized.  Heart Rate: 121  bpm  CRL:  8  mm   6 w   5 d                  [place] EDC: 05/23/2017  Maternal uterus/adnexae:  Subchorionic hemorrhage: None  Right ovary: Normal  Left ovary: Cyst in the left ovary measures 2.6 x 3.1 x 2.7 cm  Other :None  Free fluid:  Small amount of free fluid noted  IMPRESSION: 1. Single living intrauterine gestation. The estimated gestational age is 6 weeks and 5 days. 2. Small amount of free fluid noted  within the pelvis. 3. Left ovary cyst measures 3 cm  Assessment:   Threatened miscarriage Pregnancy dating inconsistent with LMP  Plan:   1. Will repeat ultrasound for  viability today in light of continued spotting.  If pregnancy normal, patient to f/u in 2-3 weeks for NOB intake.   2. Patient with LMP inconsistent with gestational age. Based on 6 week ultrasound, EDD is 05/23/2017 and patient is now currently [redacted] weeks gestation.    Hildred Laser, MD [facility]

## 2016-10-26 ENCOUNTER — Ambulatory Visit (INDEPENDENT_AMBULATORY_CARE_PROVIDER_SITE_OTHER): Payer: 59 | Admitting: Obstetrics and Gynecology

## 2016-10-26 ENCOUNTER — Telehealth: Payer: Self-pay

## 2016-10-26 VITALS — BP 93/65 | HR 72 | Ht 64.5 in | Wt 160.4 lb

## 2016-10-26 DIAGNOSIS — Z3481 Encounter for supervision of other normal pregnancy, first trimester: Secondary | ICD-10-CM

## 2016-10-26 DIAGNOSIS — Z1389 Encounter for screening for other disorder: Secondary | ICD-10-CM

## 2016-10-26 DIAGNOSIS — Z113 Encounter for screening for infections with a predominantly sexual mode of transmission: Secondary | ICD-10-CM

## 2016-10-26 MED ORDER — MONTELUKAST SODIUM 10 MG PO TABS: 10.0000 mg | ORAL_TABLET | Freq: Every day | ORAL | 6 refills | Status: AC

## 2016-10-26 MED ORDER — ALBUTEROL SULFATE HFA 108 (90 BASE) MCG/ACT IN AERS: 2.0000 | INHALATION_SPRAY | Freq: Four times a day (QID) | RESPIRATORY_TRACT | 2 refills | Status: DC | PRN

## 2016-10-26 NOTE — Progress Notes (Signed)
Christine Bowers presents for NOB nurse interview visit. Pregnancy confirmation done at ER and seen by Dr. Valentino Saxonherry on 10/10/2016 for f/u to TAB. Pt denies any vaginal bleeding or pain at this time.  G-6,  P-3023 . Pregnancy education material explained and given. No cats in the home. NOB labs ordered.  HIV labs and Drug screen were explained optional and she did not decline. Drug screen ordered. PNV encouraged. Genetic screening options discussed and is undecided.  Pt had ultrasound in ER and on 10/10/16 as f/u to TAB. Pt requested singular, and rescue inhaler of which MNS approves. Pt to inquire about Advair inhaler on NOB visit next week if she has not heard anything. MNS unsure about using in first trimester. Also pt's mother had Von Willa brands disease.  Pt. To follow up with provider in 1 weeks for NOB physical.  All questions answered.

## 2016-10-26 NOTE — Patient Instructions (Signed)
Pregnancy and Toxoplasmosis Toxoplasmosis is an infection that is caused by a parasite. Usually, there are no symptoms and the body can fight off the infection. If you get toxoplasmosis during pregnancy, there is a chance that the infection will spread to your baby. If this happens, your baby may develop serious health problems, such as blindness, intellectual disabilities, and other neurological disorders. Some of these problems may not show up for years. How do people get toxoplasmosis? You can get toxoplasmosis if:  You touch anything that is contaminated with infected cat feces and then you touch your mouth.  You eat raw or undercooked meat from an infected animal.  You eat fruits and vegetables that were grown in contaminated soil. Your baby can get toxoplasmosis through your blood supply if you are infected during pregnancy or just before pregnancy. How can I protect myself and my baby against toxoplasmosis?  Do not get a new cat.  Do not touch stray cats.  If you have a sandbox, cover it when it is not being used.  Avoid working in soil where cats may leave feces.  Wear gloves when you work in the soil. Wash your hands with soap and water when you are finished.  If you have a cat:  Have someone else change the cat's litter box daily. He or she should wash his or her hands afterward.  Do not let your cat outside.  Do not feed your cat any raw meat.  Do not eat undercooked meat, especially meat that has never been frozen.  Wash and peel all fruits and vegetables before eating them.  Avoid drinking untreated water.  If you have toxoplasmosis and you are not pregnant, wait at least 6 months before becoming pregnant. How do I know if I have toxoplasmosis? The only way to know for sure that you have toxoplasmosis is with a test. People with toxoplasmosis do not always have symptoms. If symptoms are present, they may include:  A fever.  Swollen glands.  Muscle  aches.  Headaches.  Feeling like you have a cold or the flu. If you think you have toxoplasmosis, or if you think that you may have been exposed to it, call your health care provider. How is toxoplasmosis diagnosed? When you become pregnant, your health care provider may order a blood test to check whether you have ever had toxoplasmosis.  If you have had toxoplasmosis infection before, you cannot get it again.  If you have never had toxoplasmosis, your health care provider may repeat this test at a later date.  If you become infected during pregnancy, your health care provider may do more tests to find out whether the infection has spread to the baby.  Other tests may include an ultrasound and a test of your amniotic fluid (amniocentesis). How is toxoplasmosis treated? Toxoplasmosis may be treated with antibiotics and other medicines.  Some of these medicines can lower your baby's chance of developing complications later on.  Medicines may need to be taken for up to one year. What should I do at home if I am diagnosed with toxoplasmosis?  Take over-the-counter and prescription medicines only as told by your health care provider.  If you were prescribed an antibiotic medicine, take it as told by your health care provider. Do not stop taking the antibiotic even if you start to feel better.  Keep all follow-up visits as told by your health care provider. This is important. This information is not intended to replace advice given to   you by your health care provider. Make sure you discuss any questions you have with your health care provider. Document Released: 12/31/2000 Document Revised: 05/24/2016 Document Reviewed: 04/29/2016 Elsevier Interactive Patient Education  2017 Elsevier Inc. Minor Illnesses and Medications in Pregnancy  Cold/Flu:  Sudafed for congestion- Robitussin (plain) for cough- Tylenol for discomfort.  Please follow the directions on the label.  Try not to take any  more than needed.  OTC Saline nasal spray and air humidifier or cool-mist  Vaporizer to sooth nasal irritation and to loosen congestion.  It is also important to increase intake of non carbonated fluids, especially if you have a fever.  Constipation:  Colace-2 capsules at bedtime; Metamucil- follow directions on label; Senokot- 1 tablet at bedtime.  Any one of these medications can be used.  It is also very important to increase fluids and fruits along with regular exercise.  If problem persists please call the office.  Diarrhea:  Kaopectate as directed on the label.  Eat a bland diet and increase fluids.  Avoid highly seasoned foods.  Headache:  Tylenol 1 or 2 tablets every 3-4 hours as needed  Indigestion:  Maalox, Mylanta, Tums or Rolaids- as directed on label.  Also try to eat small meals and avoid fatty, greasy or spicy foods.  Nausea with or without Vomiting:  Nausea in pregnancy is caused by increased levels of hormones in the body which influence the digestive system and cause irritation when stomach acids accumulate.  Symptoms usually subside after 1st trimester of pregnancy.  Try the following: 1. Keep saltines, graham crackers or dry toast by your bed to eat upon awakening. 2. Don't let your stomach get empty.  Try to eat 5-6 small meals per day instead of 3 large ones. 3. Avoid greasy fatty or highly seasoned foods.  4. Take OTC Unisom 1 tablet at bed time along with OTC Vitamin B6 25-50 mg 3 times per day.    If nausea continues with vomiting and you are unable to keep down food and fluids you may need a prescription medication.  Please notify your provider.   Sore throat:  Chloraseptic spray, throat lozenges and or plain Tylenol.  Vaginal Yeast Infection:  OTC Monistat for 7 days as directed on label.  If symptoms do not resolve within a week notify provider.  If any of the above problems do not subside with recommended treatment please call the office for further assistance.   Do  not take Aspirin, Advil, Motrin or Ibuprofen.  * * OTC= Over the counter Hyperemesis Gravidarum Hyperemesis gravidarum is a severe form of nausea and vomiting that happens during pregnancy. Hyperemesis is worse than morning sickness. It may cause you to have nausea or vomiting all day for many days. It may keep you from eating and drinking enough food and liquids. Hyperemesis usually occurs during the first half (the first 20 weeks) of pregnancy. It often goes away once a woman is in her second half of pregnancy. However, sometimes hyperemesis continues through an entire pregnancy. What are the causes? The cause of this condition is not known. It may be related to changes in chemicals (hormones) in the body during pregnancy, such as the high level of pregnancy hormone (human chorionic gonadotropin) or the increase in the female sex hormone (estrogen). What are the signs or symptoms? Symptoms of this condition include:  Severe nausea and vomiting.  Nausea that does not go away.  Vomiting that does not allow you to keep any food down.  Weight loss.  Body fluid loss (dehydration).  Having no desire to eat, or not liking food that you have previously enjoyed. How is this diagnosed? This condition may be diagnosed based on:  A physical exam.  Your medical history.  Your symptoms.  Blood tests.  Urine tests. How is this treated? This condition may be managed with medicine. If medicines to do not help relieve nausea and vomiting, you may need to receive fluids through an IV tube at the hospital. Follow these instructions at home:  Take over-the-counter and prescription medicines only as told by your health care provider.  Avoid iron pills and multivitamins that contain iron for the first 3-4 months of pregnancy. If you take prescription iron pills, do not stop taking them unless your health care provider approves.  Take the following actions to help prevent nausea and vomiting:  In  the morning, before getting out of bed, try eating a couple of dry crackers or a piece of toast.  Avoid foods and smells that upset your stomach. Fatty and spicy foods may make nausea worse.  Eat 5-6 small meals a day.  Do not drink fluids while eating meals. Drink between meals.  Eat or suck on things that have ginger in them. Ginger can help relieve nausea.  Avoid food preparation. The smell of food can spoil your appetite or trigger nausea.  Follow instructions from your health care provider about eating or drinking restrictions.  For snacks, eat high-protein foods, such as cheese.  Keep all follow-up and pre-birth (prenatal) visits as told by your health care provider. This is important. Contact a health care provider if:  You have pain in your abdomen.  You have a severe headache.  You have vision problems.  You are losing weight. Get help right away if:  You cannot drink fluids without vomiting.  You vomit blood.  You have constant nausea and vomiting.  You are very weak.  You are very thirsty.  You feel dizzy.  You faint.  You have a fever or other symptoms that last for more than 2-3 days.  You have a fever and your symptoms suddenly get worse. Summary  Hyperemesis gravidarum is a severe form of nausea and vomiting that happens during pregnancy.  Making some changes to your eating habits may help relieve nausea and vomiting.  This condition may be managed with medicine.  If medicines to do not help relieve nausea and vomiting, you may need to receive fluids through an IV tube at the hospital. This information is not intended to replace advice given to you by your health care provider. Make sure you discuss any questions you have with your health care provider. Document Released: 09/24/2005 Document Revised: 05/23/2016 Document Reviewed: 05/23/2016 Elsevier Interactive Patient Education  2017 [employer] Commonly Asked Questions During  Pregnancy  Cats: A parasite can be excreted in cat feces.  To avoid exposure you need to have another person empty the little box.  If you must empty the litter box you will need to wear gloves.  Wash your hands after handling your cat.  This parasite can also be found in raw or undercooked meat so this should also be avoided.  Colds, Sore Throats, Flu: Please check your medication sheet to see what you can take for symptoms.  If your symptoms are unrelieved by these medications please call the office.  Dental Work: Most any dental work Agricultural consultantyour dentist recommends is permitted.  X-rays should only be taken during the first  trimester if absolutely necessary.  Your abdomen should be shielded with a lead apron during all x-rays.  Please notify your provider prior to receiving any x-rays.  Novocaine is fine; gas is not recommended.  If your dentist requires a note from us prior to dental work please call the office and we will provide one for you.  Exercise: Exercise is an important part of staying healthy during your pregnancy.  You may continue most exercises you were accustomed to prior to pregnancy.  Later in your pregnancy you will most likely notice you have difficulty with activities requiring balance like riding a bicycle.  It is important that you listen to your body and avoid activities that put you at a higher risk of falling.  Adequate rest and staying well hydrated are a must!  If you have questions about the safety of specific activities ask your provider.    Exposure to Children with illness: Try to avoid obvious exposure; report any symptoms to us when noted,  If you have chicken pos, red measles or mumps, you should be immune to these diseases.   Please do not take any vaccines while pregnant unless you have checked with your OB provider.  Fetal Movement: After 28 weeks we recommend you do "kick counts" twice daily.  Lie or sit down in a calm quiet environment and count your baby movements  "kicks".  You should feel your baby at least 10 times per hour.  If you have not felt 10 kicks within the first hour get up, walk around and have something sweet to eat or drink then repeat for an additional hour.  If count remains less than 10 per hour notify your provider.  Fumigating: Follow your pest control agent's advice as to how long to stay out of your home.  Ventilate the area well before re-entering.  Hemorrhoids:   Most over-the-counter preparations can be used during pregnancy.  Check your medication to see what is safe to use.  It is important to use a stool softener or fiber in your diet and to drink lots of liquids.  If hemorrhoids seem to be getting worse please call the office.   Hot Tubs:  Hot tubs Jacuzzis and saunas are not recommended while pregnant.  These increase your internal body temperature and should be avoided.  Intercourse:  Sexual intercourse is safe during pregnancy as long as you are comfortable, unless otherwise advised by your provider.  Spotting may occur after intercourse; report any bright red bleeding that is heavier than spotting.  Labor:  If you know that you are in labor, please go to the hospital.  If you are unsure, please call the office and let us help you decide what to do.  Lifting, straining, etc:  If your job requires heavy lifting or straining please check with your provider for any limitations.  Generally, you should not lift items heavier than that you can lift simply with your hands and arms (no back muscles)  Painting:  Paint fumes do not harm your pregnancy, but may make you ill and should be avoided if possible.  Latex or water based paints have less odor than oils.  Use adequate ventilation while painting.  Permanents & Hair Color:  Chemicals in hair dyes are not recommended as they cause increase hair dryness which can increase hair loss during pregnancy.  " Highlighting" and permanents are allowed.  Dye may be absorbed differently and  permanents may not hold as well during pregnancy.  Sunbathing:  Use a sunscreen, as skin burns easily during pregnancy.  Drink plenty of fluids; avoid over heating.  Tanning Beds:  Because their possible side effects are still unknown, tanning beds are not recommended.  Ultrasound Scans:  Routine ultrasounds are performed at approximately 20 weeks.  You will be able to see your baby's general anatomy an if you would like to know the gender this can usually be determined as well.  If it is questionable when you conceived you may also receive an ultrasound early in your pregnancy for dating purposes.  Otherwise ultrasound exams are not routinely performed unless there is a medical necessity.  Although you can request a scan we ask that you pay for it when conducted because insurance does not cover " patient request" scans.  Work: If your pregnancy proceeds without complications you may work until your due date, unless your physician or employer advises otherwise.  Round Ligament Pain/Pelvic Discomfort:  Sharp, shooting pains not associated with bleeding are fairly common, usually occurring in the second trimester of pregnancy.  They tend to be worse when standing up or when you remain standing for long periods of time.  These are the result of pressure of certain pelvic ligaments called "round ligaments".  Rest, Tylenol and heat seem to be the most effective relief.  As the womb and fetus grow, they rise out of the pelvis and the discomfort improves.  Please notify the office if your pain seems different than that described.  It may represent a more serious condition.

## 2016-10-26 NOTE — Telephone Encounter (Signed)
Pt needs confirmation of pregnancy faxed to [facility]. [phone]. Thanks.

## 2016-10-27 LAB — RUBELLA SCREEN: Rubella Antibodies, IGG: 1.59 index (ref >0.99)

## 2016-10-27 LAB — CBC WITH DIFFERENTIAL/PLATELET
BASOS: 0 %
Basophils Absolute: 0 10*3/uL (ref 0.0–0.2)
EOS (ABSOLUTE): 0.2 10*3/uL (ref 0.0–0.4)
EOS: 3 %
HEMATOCRIT: 38.6 % (ref 34.0–46.6)
Hemoglobin: 13.1 g/dL (ref 11.1–15.9)
Immature Grans (Abs): 0 10*3/uL (ref 0.0–0.1)
Immature Granulocytes: 0 %
LYMPHS ABS: 2.1 10*3/uL (ref 0.7–3.1)
Lymphs: 29 %
MCH: 31.6 pg (ref 26.6–33.0)
MCHC: 33.9 g/dL (ref 31.5–35.7)
MCV: 93 fL (ref 79–97)
MONOS ABS: 0.5 10*3/uL (ref 0.1–0.9)
Monocytes: 7 %
NEUTROS ABS: 4.4 10*3/uL (ref 1.4–7.0)
Neutrophils: 61 %
Platelets: 351 10*3/uL (ref 150–379)
RBC: 4.15 x10E6/uL (ref 3.77–5.28)
RDW: 13.2 % (ref 12.3–15.4)
WBC: 7.3 10*3/uL (ref 3.4–10.8)

## 2016-10-27 LAB — HIV ANTIBODY (ROUTINE TESTING W REFLEX): HIV Screen 4th Generation wRfx: NONREACTIVE

## 2016-10-27 LAB — HEPATITIS B SURFACE ANTIGEN: Hepatitis B Surface Ag: NEGATIVE

## 2016-10-27 LAB — ABO

## 2016-10-27 LAB — ANTIBODY SCREEN: Antibody Screen: NEGATIVE

## 2016-10-27 LAB — RH TYPE: Rh Factor: POSITIVE

## 2016-10-27 LAB — RPR: RPR Ser Ql: NONREACTIVE

## 2016-10-27 LAB — VARICELLA ZOSTER ANTIBODY, IGG: VARICELLA: 1043 {index} (ref >165)

## 2016-10-28 LAB — URINE CULTURE, OB REFLEX: Organism ID, Bacteria: NO GROWTH

## 2016-10-28 LAB — GC/CHLAMYDIA PROBE AMP
Chlamydia trachomatis, NAA: NEGATIVE
Neisseria gonorrhoeae by PCR: NEGATIVE

## 2016-10-28 LAB — CULTURE, OB URINE

## 2016-10-29 LAB — OB RESULTS CONSOLE VARICELLA ZOSTER ANTIBODY, IGG: Varicella: IMMUNE

## 2016-10-29 LAB — MONITOR DRUG PROFILE 14(MW)
Amphetamine Scrn, Ur: NEGATIVE ng/mL
BARBITURATE SCREEN URINE: NEGATIVE ng/mL
BENZODIAZEPINE SCREEN, URINE: NEGATIVE ng/mL
Buprenorphine, Urine: NEGATIVE ng/mL
CANNABINOIDS UR QL SCN: NEGATIVE ng/mL
Cocaine (Metab) Scrn, Ur: NEGATIVE ng/mL
Creatinine(Crt), U: 99.4 mg/dL (ref 20.0–300.0)
Fentanyl, Urine: NEGATIVE pg/mL
Meperidine Screen, Urine: NEGATIVE ng/mL
Methadone Screen, Urine: NEGATIVE ng/mL
OXYCODONE+OXYMORPHONE UR QL SCN: NEGATIVE ng/mL
Opiate Scrn, Ur: NEGATIVE ng/mL
Ph of Urine: 6.2 (ref 4.5–8.9)
Phencyclidine Qn, Ur: NEGATIVE ng/mL
Propoxyphene Scrn, Ur: NEGATIVE ng/mL
SPECIFIC GRAVITY: 1.022
Tramadol Screen, Urine: NEGATIVE ng/mL

## 2016-10-29 LAB — URINALYSIS, ROUTINE W REFLEX MICROSCOPIC
BILIRUBIN UA: NEGATIVE
GLUCOSE, UA: NEGATIVE
Ketones, UA: NEGATIVE
Leukocytes, UA: NEGATIVE
NITRITE UA: NEGATIVE
PH UA: 6.5 (ref 5.0–7.5)
PROTEIN UA: NEGATIVE
RBC UA: NEGATIVE
Specific Gravity, UA: 1.021 (ref 1.005–1.030)
UUROB: 0.2 mg/dL (ref 0.2–1.0)

## 2016-10-29 LAB — NICOTINE SCREEN, URINE: Cotinine Ql Scrn, Ur: NEGATIVE ng/mL

## 2016-10-29 NOTE — Telephone Encounter (Signed)
Pt notified that letter for confirmation faxed to [facility].

## 2016-11-02 ENCOUNTER — Encounter: Payer: Self-pay | Admitting: Certified Nurse Midwife

## 2016-11-02 ENCOUNTER — Ambulatory Visit (INDEPENDENT_AMBULATORY_CARE_PROVIDER_SITE_OTHER): Payer: 59 | Admitting: Certified Nurse Midwife

## 2016-11-02 ENCOUNTER — Other Ambulatory Visit: Payer: Self-pay | Admitting: Certified Nurse Midwife

## 2016-11-02 VITALS — BP 102/69 | HR 88 | Wt 161.0 lb

## 2016-11-02 DIAGNOSIS — Z3482 Encounter for supervision of other normal pregnancy, second trimester: Secondary | ICD-10-CM

## 2016-11-02 DIAGNOSIS — Z124 Encounter for screening for malignant neoplasm of cervix: Secondary | ICD-10-CM

## 2016-11-02 LAB — POCT URINALYSIS DIPSTICK
Bilirubin, UA: NEGATIVE
Glucose, UA: NEGATIVE
Ketones, UA: NEGATIVE
Leukocytes, UA: NEGATIVE
NITRITE UA: NEGATIVE
PROTEIN UA: NEGATIVE
SPEC GRAV UA: 1.02
UROBILINOGEN UA: 1
pH, UA: 7

## 2016-11-02 NOTE — Progress Notes (Signed)
NEW OB HISTORY AND PHYSICAL  SUBJECTIVE:       Christine Bowers is a 36 y.o. [phone] female, Patient's last menstrual period was 08/08/2016., Estimated Date of Delivery: 05/15/17, [redacted]w[redacted]d, presents today for establishment of Prenatal Care.  She reports increased nasal congestion and complains of nausea with vomiting a single episode of vomiting last week.   Pt was previously prescribed inhalers, but has not picked them up from the pharmacy.    Gynecologic History  Patient's last menstrual period was 08/08/2016.   Last Pap: unsure. Results were: normal  Obstetric History OB History  Gravida Para Term Preterm AB Living  6 3 3   2 3   SAB TAB Ectopic Multiple Live Births  1   1   3     # Outcome Date GA Lbr Len/2nd Weight Sex Delivery Anes PTL Lv  6 Current           5 Ectopic 04/2016          4 Term 03/26/14 [redacted]w[redacted]d  8 lb 9 oz (3.884 kg) M Vag-Spont   LIV  3 Term 04/29/06 [redacted]w[redacted]d  8 lb 7 oz (3.827 kg) M Vag-Spont   LIV  2 Term 11/08/03 [redacted]w[redacted]d  8 lb 5 oz (3.771 kg) F Vag-Spont   LIV  1 SAB  [redacted]w[redacted]d           Obstetric Comments  1st delivery [facility] [employer]  2nd delivery in [place]  3rd delivery at [facility]    Past Medical History:  Diagnosis Date  . Asthma   . Miscarriage     Past Surgical History:  Procedure Laterality Date  . DILATION AND CURETTAGE OF UTERUS    . DILATION AND EVACUATION N/A 05/04/2016   Procedure: DILATATION AND EVACUATION;  Surgeon: Christeen Douglas, MD;  Location: [facility] ORS;  Service: Gynecology;  Laterality: N/A;    Current Outpatient Prescriptions on File Prior to Visit  Medication Sig Dispense Refill  . albuterol (PROVENTIL HFA;VENTOLIN HFA) 108 (90 Base) MCG/ACT inhaler Inhale 2 puffs into the lungs every 6 (six) hours as needed for wheezing or shortness of breath. 1 Inhaler 2  . montelukast (SINGULAIR) 10 MG tablet Take 1 tablet (10 mg total) by mouth at bedtime. 30 tablet 6  . Prenatal Vit-Fe Fumarate-FA (PRENATAL VITAMINS) 28-0.8 MG TABS Take 1 tablet by mouth  daily.     No current facility-administered medications on file prior to visit.     Allergies  Allergen Reactions  . Aspirin Other (See Comments)  . Ibuprofen Other (See Comments)    Social History   Social History  . Marital status: Single    Spouse name: N/A  . Number of children: N/A  . Years of education: N/A   Occupational History  .  [employer]   Social History Main Topics  . Smoking status: Current Some Day Smoker    Packs/day: 0.50    Years: 20.00    Types: Cigarettes  . Smokeless tobacco: Never Used  . Alcohol use No  . Drug use: No  . Sexual activity: Yes    Partners: Female    Birth control/ protection: None   Other Topics Concern  . Not on file   Social History Narrative  . No narrative on file    Family History  Problem Relation Age of Onset  . Thyroid disease Mother   . Hypertension Father     The following portions of the patient's history were reviewed and updated as appropriate: allergies, current  medications, past OB history, past medical history, past surgical history, past family history, past social history, and problem list.    OBJECTIVE: Initial Physical Exam (New OB)  GENERAL APPEARANCE: alert, well appearing, in no apparent distress  HEAD: normocephalic, atraumatic  MOUTH: mucous membranes moist, pharynx normal without lesions  THYROID: no thyromegaly or masses present  BREASTS: no masses noted, no significant tenderness, no palpable axillary nodes, no skin changes  LUNGS: clear to auscultation, no wheezes, rales or rhonchi, symmetric air entry  HEART: regular rate and rhythm, no murmurs  ABDOMEN: soft, nontender, nondistended, no abnormal masses, no epigastric pain, fundus soft, nontender 12 weeks size and FHT present  EXTREMITIES: no redness or tenderness in the calves or thighs  SKIN: normal coloration and turgor, no rashes  LYMPH NODES: no adenopathy palpable  NEUROLOGIC: alert, oriented, normal speech, no  focal findings or movement disorder noted  PELVIC EXAM    EXTERNAL GENITALIA: normal appearing vulva with no masses, tenderness or lesions  VAGINA: no abnormal discharge or lesions  CERVIX: no lesions or cervical motion tenderness  UTERUS: gravid, consistent with 12 weeks and anteverted  ADNEXA: no masses palpable and nontender  ASSESSMENT: Normal pregnancy Current smoker  PLAN: PAP completed Prenatal care Declines genetic testing  Smoking cessation encouraged RTC x 4 weeks for ROB See orders   Gunnar BullaJenkins Michelle Tacie Mccuistion, CNM

## 2016-11-02 NOTE — Patient Instructions (Signed)
Second Trimester of Pregnancy The second trimester is from week 13 through week 28, month 4 through 6. This is often the time in pregnancy that you feel your best. Often times, morning sickness has lessened or quit. You may have more energy, and you may get hungry more often. Your unborn baby (fetus) is growing rapidly. At the end of the sixth month, he or she is about 9 inches long and weighs about 1 pounds. You will likely feel the baby move (quickening) between 18 and 20 weeks of pregnancy. Follow these instructions at home:  Avoid all smoking, herbs, and alcohol. Avoid drugs not approved by your doctor.  Do not use any tobacco products, including cigarettes, chewing tobacco, and electronic cigarettes. If you need help quitting, ask your doctor. You may get counseling or other support to help you quit.  Only take medicine as told by your doctor. Some medicines are safe and some are not during pregnancy.  Exercise only as told by your doctor. Stop exercising if you start having cramps.  Eat regular, healthy meals.  Wear a good support bra if your breasts are tender.  Do not use hot tubs, steam rooms, or saunas.  Wear your seat belt when driving.  Avoid raw meat, uncooked cheese, and liter boxes and soil used by cats.  Take your prenatal vitamins.  Take 1500-2000 milligrams of calcium daily starting at the 20th week of pregnancy until you deliver your baby.  Try taking medicine that helps you poop (stool softener) as needed, and if your doctor approves. Eat more fiber by eating fresh fruit, vegetables, and whole grains. Drink enough fluids to keep your pee (urine) clear or pale yellow.  Take warm water baths (sitz baths) to soothe pain or discomfort caused by hemorrhoids. Use hemorrhoid cream if your doctor approves.  If you have puffy, bulging veins (varicose veins), wear support hose. Raise (elevate) your feet for 15 minutes, 3-4 times a day. Limit salt in your diet.  Avoid heavy  lifting, wear low heals, and sit up straight.  Rest with your legs raised if you have leg cramps or low back pain.  Visit your dentist if you have not gone during your pregnancy. Use a soft toothbrush to brush your teeth. Be gentle when you floss.  You can have sex (intercourse) unless your doctor tells you not to.  Go to your doctor visits. Get help if:  You feel dizzy.  You have mild cramps or pressure in your lower belly (abdomen).  You have a nagging pain in your belly area.  You continue to feel sick to your stomach (nauseous), throw up (vomit), or have watery poop (diarrhea).  You have bad smelling fluid coming from your vagina.  You have pain with peeing (urination). Get help right away if:  You have a fever.  You are leaking fluid from your vagina.  You have spotting or bleeding from your vagina.  You have severe belly cramping or pain.  You lose or gain weight rapidly.  You have trouble catching your breath and have chest pain.  You notice sudden or extreme puffiness (swelling) of your face, hands, ankles, feet, or legs.  You have not felt the baby move in over an hour.  You have severe headaches that do not go away with medicine.  You have vision changes. This information is not intended to replace advice given to you by your health care provider. Make sure you discuss any questions you have with your health care   provider. Document Released: 12/19/2009 Document Revised: 03/01/2016 Document Reviewed: 11/25/2012 Elsevier Interactive Patient Education  2017 Elsevier Inc.  

## 2016-11-06 LAB — CYTOLOGY - PAP

## 2016-11-30 ENCOUNTER — Encounter: Payer: Self-pay | Admitting: Certified Nurse Midwife

## 2016-11-30 ENCOUNTER — Ambulatory Visit (INDEPENDENT_AMBULATORY_CARE_PROVIDER_SITE_OTHER): Payer: 59 | Admitting: Certified Nurse Midwife

## 2016-11-30 VITALS — BP 94/48 | HR 88 | Wt 163.6 lb

## 2016-11-30 DIAGNOSIS — Z3482 Encounter for supervision of other normal pregnancy, second trimester: Secondary | ICD-10-CM

## 2016-11-30 NOTE — Patient Instructions (Addendum)
Round Ligament Pain Introduction The round ligament is a cord of muscle and tissue that helps to support the uterus. It can become a source of pain during pregnancy if it becomes stretched or twisted as the baby grows. The pain usually begins in the second trimester of pregnancy, and it can come and go until the baby is delivered. It is not a serious problem, and it does not cause harm to the baby. Round ligament pain is usually a short, sharp, and pinching pain, but it can also be a dull, lingering, and aching pain. The pain is felt in the lower side of the abdomen or in the groin. It usually starts deep in the groin and moves up to the outside of the hip area. Pain can occur with:  A sudden change in position.  Rolling over in bed.  Coughing or sneezing.  Physical activity. Follow these instructions at home: Watch your condition for any changes. Take these steps to help with your pain:  When the pain starts, relax. Then try:  Sitting down.  Flexing your knees up to your abdomen.  Lying on your side with one pillow under your abdomen and another pillow between your legs.  Sitting in a warm bath for 15-20 minutes or until the pain goes away.  Take over-the-counter and prescription medicines only as told by your health care provider.  Move slowly when you sit and stand.  Avoid long walks if they cause pain.  Stop or lessen your physical activities if they cause pain. Contact a health care provider if:  Your pain does not go away with treatment.  You feel pain in your back that you did not have before.  Your medicine is not helping. Get help right away if:  You develop a fever or chills.  You develop uterine contractions.  You develop vaginal bleeding.  You develop nausea or vomiting.  You develop diarrhea.  You have pain when you urinate. This information is not intended to replace advice given to you by your health care provider. Make sure you discuss any questions  you have with your health care provider. Document Released: 07/03/2008 Document Revised: 03/01/2016 Document Reviewed: 12/01/2014  2017 Elsevier Minor Illnesses and Medications in Pregnancy  Cold/Flu:  Sudafed for congestion- Robitussin (plain) for cough- Tylenol for discomfort.  Please follow the directions on the label.  Try not to take any more than needed.  OTC Saline nasal spray and air humidifier or cool-mist  Vaporizer to sooth nasal irritation and to loosen congestion.  It is also important to increase intake of non carbonated fluids, especially if you have a fever.  Constipation:  Colace-2 capsules at bedtime; Metamucil- follow directions on label; Senokot- 1 tablet at bedtime.  Any one of these medications can be used.  It is also very important to increase fluids and fruits along with regular exercise.  If problem persists please call the office.  Diarrhea:  Kaopectate as directed on the label.  Eat a bland diet and increase fluids.  Avoid highly seasoned foods.  Headache:  Tylenol 1 or 2 tablets every 3-4 hours as needed  Indigestion:  Maalox, Mylanta, Tums or Rolaids- as directed on label.  Also try to eat small meals and avoid fatty, greasy or spicy foods.  Nausea with or without Vomiting:  Nausea in pregnancy is caused by increased levels of hormones in the body which influence the digestive system and cause irritation when stomach acids accumulate.  Symptoms usually subside after 1st trimester  of pregnancy.  Try the following: 1. Keep saltines, graham crackers or dry toast by your bed to eat upon awakening. 2. Don't let your stomach get empty.  Try to eat 5-6 small meals per day instead of 3 large ones. 3. Avoid greasy fatty or highly seasoned foods.  4. Take OTC Unisom 1 tablet at bed time along with OTC Vitamin B6 25-50 mg 3 times per day.    If nausea continues with vomiting and you are unable to keep down food and fluids you may need a prescription medication.  Please notify  your provider.   Sore throat:  Chloraseptic spray, throat lozenges and or plain Tylenol.  Vaginal Yeast Infection:  OTC Monistat for 7 days as directed on label.  If symptoms do not resolve within a week notify provider.  If any of the above problems do not subside with recommended treatment please call the office for further assistance.   Do not take Aspirin, Advil, Motrin or Ibuprofen.  * * OTC= Over the counter

## 2016-11-30 NOTE — Progress Notes (Signed)
ROB-Pt doing well. No asthma flares or use of inhalers. Reports clear nasal drainage and cough x 1 weeks. Denies difficulty breathing or respiratory distress. Reviewed use OTC medications to manage symptoms. Discussed red flag symptoms and when to call. RTC x 4 weeks for ROB and anatomy scan.

## 2016-11-30 NOTE — Progress Notes (Signed)
Pt presents for a routine OB visit. 

## 2016-12-28 ENCOUNTER — Ambulatory Visit (INDEPENDENT_AMBULATORY_CARE_PROVIDER_SITE_OTHER): Payer: 59

## 2016-12-28 ENCOUNTER — Encounter: Payer: Self-pay | Admitting: Certified Nurse Midwife

## 2016-12-28 ENCOUNTER — Ambulatory Visit (INDEPENDENT_AMBULATORY_CARE_PROVIDER_SITE_OTHER): Payer: 59 | Admitting: Certified Nurse Midwife

## 2016-12-28 VITALS — BP 99/55 | HR 82 | Wt 168.5 lb

## 2016-12-28 DIAGNOSIS — Z3482 Encounter for supervision of other normal pregnancy, second trimester: Secondary | ICD-10-CM | POA: Diagnosis not present

## 2016-12-28 DIAGNOSIS — Z3492 Encounter for supervision of normal pregnancy, unspecified, second trimester: Secondary | ICD-10-CM

## 2016-12-28 LAB — POCT URINALYSIS DIPSTICK
BILIRUBIN UA: NEGATIVE
Blood, UA: NEGATIVE
Glucose, UA: NEGATIVE
Ketones, UA: NEGATIVE
LEUKOCYTES UA: NEGATIVE
NITRITE UA: NEGATIVE
PH UA: 5 (ref 5.0–8.0)
PROTEIN UA: NEGATIVE
Spec Grav, UA: 1.02 (ref 1.030–1.035)
Urobilinogen, UA: NEGATIVE (ref ?–2.0)

## 2016-12-28 NOTE — Progress Notes (Signed)
ROB-Pt doing well. Discussed US findings: anatomy wnl, small placenta lake present. Reviewed bleeding precautions, red flag symptoms and when to call. RTC x 4 weeks for ROB.   ULTRASOUND REPORT  Location: [facility] Date of Service: 12/28/16  Indications: Anatomy Findings:  Singleton intrauterine pregnancy is visualized with FHR at 155 BPM. Biometrics give an (U/S) Gestational age of 119 weeks and 3 days, and an (U/S) EDD of 05/21/17; this correlates with the clinically established EDD of 05/23/17.  Fetal presentation is breech, spine variable.  EFW: 299 grams ( 0 lbs. 11 oz.) Placenta: Anterior/Fundal, grade 1 with a 2.3 x 1.0 x 1.4 cm placental lake. Placenta is remote to cx at 6.6 cm. AFI: Subjectively adequate with an MVP of 5.1 cm.  Anatomic survey is complete and appears WNL. Gender - Female.   Right Ovary measures 3.1 x 1.7 x 1.9 cm, and appears WNL. Left Ovary measures 2.5 x 1.5 x 1.5 cm, and appears WNL. There is no evidence of a corpus luteal cyst seen. Survey of the adnexa demonstrates no adnexal masses. There is no free peritoneal fluid in the cul de sac.  Impression: 1. 19 week 3 day Viable Singleton Intrauterine pregnancy by U/S. 2. (U/S) EDD is consistent with Clinically established (LMP) EDD of 05/23/17. 3. Normal appearing anatomy scan. 4. Small placental lake.

## 2016-12-28 NOTE — Progress Notes (Signed)
Pt is here for a prenatal visit. 

## 2017-01-23 ENCOUNTER — Ambulatory Visit (INDEPENDENT_AMBULATORY_CARE_PROVIDER_SITE_OTHER): Payer: 59 | Admitting: Certified Nurse Midwife

## 2017-01-23 VITALS — BP 110/51 | HR 90 | Wt 173.1 lb

## 2017-01-23 DIAGNOSIS — Z3492 Encounter for supervision of normal pregnancy, unspecified, second trimester: Secondary | ICD-10-CM

## 2017-01-23 DIAGNOSIS — Z130 Encounter for screening for diseases of the blood and blood-forming organs and certain disorders involving the immune mechanism: Secondary | ICD-10-CM

## 2017-01-23 DIAGNOSIS — Z131 Encounter for screening for diabetes mellitus: Secondary | ICD-10-CM

## 2017-01-23 LAB — POCT URINALYSIS DIPSTICK
GLUCOSE UA: NEGATIVE
Ketones, UA: NEGATIVE
Leukocytes, UA: NEGATIVE
NITRITE UA: NEGATIVE
RBC UA: NEGATIVE
Spec Grav, UA: 1.02 (ref 1.010–1.025)
Urobilinogen, UA: 0.2 E.U./dL
pH, UA: 6 (ref 5.0–8.0)

## 2017-01-23 NOTE — Patient Instructions (Signed)
Round Ligament Pain The round ligament is a cord of muscle and tissue that helps to support the uterus. It can become a source of pain during pregnancy if it becomes stretched or twisted as the baby grows. The pain usually begins in the second trimester of pregnancy, and it can come and go until the baby is delivered. It is not a serious problem, and it does not cause harm to the baby. Round ligament pain is usually a short, sharp, and pinching pain, but it can also be a dull, lingering, and aching pain. The pain is felt in the lower side of the abdomen or in the groin. It usually starts deep in the groin and moves up to the outside of the hip area. Pain can occur with:  A sudden change in position.  Rolling over in bed.  Coughing or sneezing.  Physical activity. Follow these instructions at home: Watch your condition for any changes. Take these steps to help with your pain:  When the pain starts, relax. Then try:  Sitting down.  Flexing your knees up to your abdomen.  Lying on your side with one pillow under your abdomen and another pillow between your legs.  Sitting in a warm bath for 15-20 minutes or until the pain goes away.  Take over-the-counter and prescription medicines only as told by your health care provider.  Move slowly when you sit and stand.  Avoid long walks if they cause pain.  Stop or lessen your physical activities if they cause pain. Contact a health care provider if:  Your pain does not go away with treatment.  You feel pain in your back that you did not have before.  Your medicine is not helping. Get help right away if:  You develop a fever or chills.  You develop uterine contractions.  You develop vaginal bleeding.  You develop nausea or vomiting.  You develop diarrhea.  You have pain when you urinate. This information is not intended to replace advice given to you by your health care provider. Make sure you discuss any questions you have with  your health care provider. Document Released: 07/03/2008 Document Revised: 03/01/2016 Document Reviewed: 12/01/2014 Elsevier Interactive Patient Education  2017 Elsevier Inc.  

## 2017-01-23 NOTE — Progress Notes (Signed)
ROB- pt is doing well, denies any complaints 

## 2017-01-23 NOTE — Addendum Note (Signed)
Addended by: Shaune Spittle on: 01/23/2017 04:57 PM   Modules accepted: Orders

## 2017-01-23 NOTE — Progress Notes (Signed)
ROB-Pt doing well, no questions or concerns. Recently promoted at work, yay! Reviewed red flag symptoms and when to call. RTC x 5 weeks for glucola and ROB.

## 2017-01-25 ENCOUNTER — Encounter: Payer: 59 | Admitting: Obstetrics and Gynecology

## 2017-02-22 ENCOUNTER — Encounter: Payer: 59 | Admitting: Certified Nurse Midwife

## 2017-02-22 ENCOUNTER — Other Ambulatory Visit: Payer: 59

## 2017-03-01 ENCOUNTER — Ambulatory Visit (INDEPENDENT_AMBULATORY_CARE_PROVIDER_SITE_OTHER): Payer: Medicaid Other | Admitting: Obstetrics and Gynecology

## 2017-03-01 ENCOUNTER — Other Ambulatory Visit: Payer: 59

## 2017-03-01 VITALS — BP 95/62 | HR 90 | Wt 181.4 lb

## 2017-03-01 DIAGNOSIS — Z3493 Encounter for supervision of normal pregnancy, unspecified, third trimester: Secondary | ICD-10-CM

## 2017-03-01 DIAGNOSIS — Z23 Encounter for immunization: Secondary | ICD-10-CM | POA: Diagnosis not present

## 2017-03-01 DIAGNOSIS — Z131 Encounter for screening for diabetes mellitus: Secondary | ICD-10-CM

## 2017-03-01 LAB — POCT URINALYSIS DIPSTICK
BILIRUBIN UA: NEGATIVE
Glucose, UA: NEGATIVE
Ketones, UA: NEGATIVE
LEUKOCYTES UA: NEGATIVE
NITRITE UA: NEGATIVE
Protein, UA: NEGATIVE
RBC UA: NEGATIVE
Spec Grav, UA: 1.015 (ref 1.010–1.025)
UROBILINOGEN UA: 0.2 U/dL
pH, UA: 6.5 (ref 5.0–8.0)

## 2017-03-01 MED ORDER — TETANUS-DIPHTH-ACELL PERTUSSIS 5-2.5-18.5 LF-MCG/0.5 IM SUSP
0.5000 mL | Freq: Once | INTRAMUSCULAR | Status: AC
  Administered 2017-03-01: 0.5 mL via INTRAMUSCULAR

## 2017-03-01 NOTE — Progress Notes (Signed)
ROB- glucola done, blood consent signed,tdap given Pt is doing well 

## 2017-03-01 NOTE — Progress Notes (Signed)
I did not see patient at this visit due to out of office for a delivery at hospital.

## 2017-03-02 LAB — HEMOGLOBIN AND HEMATOCRIT, BLOOD
Hematocrit: 32.1 % — ABNORMAL LOW (ref 34.0–46.6)
Hemoglobin: 10.5 g/dL — ABNORMAL LOW (ref 11.1–15.9)

## 2017-03-02 LAB — GLUCOSE, 1 HOUR GESTATIONAL: Gestational Diabetes Screen: 85 mg/dL (ref 65–139)

## 2017-03-02 IMAGING — US US OB TRANSVAGINAL
1 series · 13 of 28 positions shown · non-contrast
Comparison: Pelvic ultrasound performed 03/09/2013

CLINICAL DATA: Acute onset of vaginal bleeding.  Initial encounter.

EXAM:
OBSTETRIC <14 WK US AND TRANSVAGINAL OB US
TECHNIQUE: Both transabdominal and transvaginal ultrasound examinations were
performed for complete evaluation of the gestation as well as the
maternal uterus, adnexal regions, and pelvic cul-de-sac.
Transvaginal technique was performed to assess early pregnancy.

[Series 1: us ob transvaginal · 0.21mm/px · 13 of 60 slices shown]
[im 3/60]
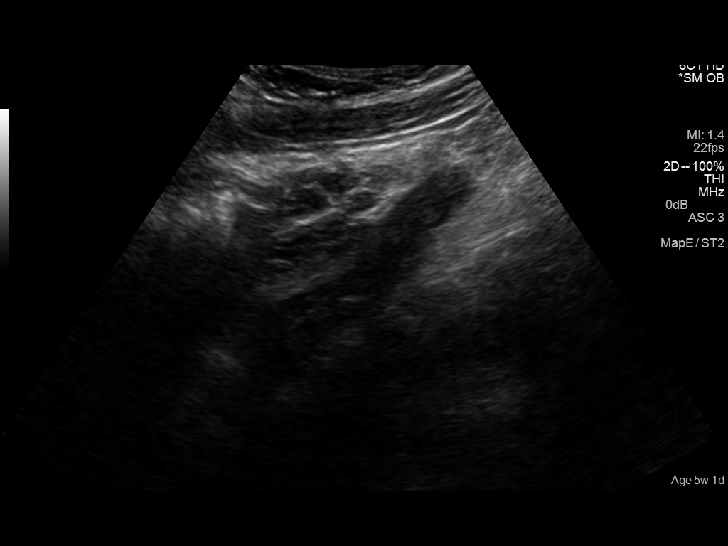
[im 7/60]
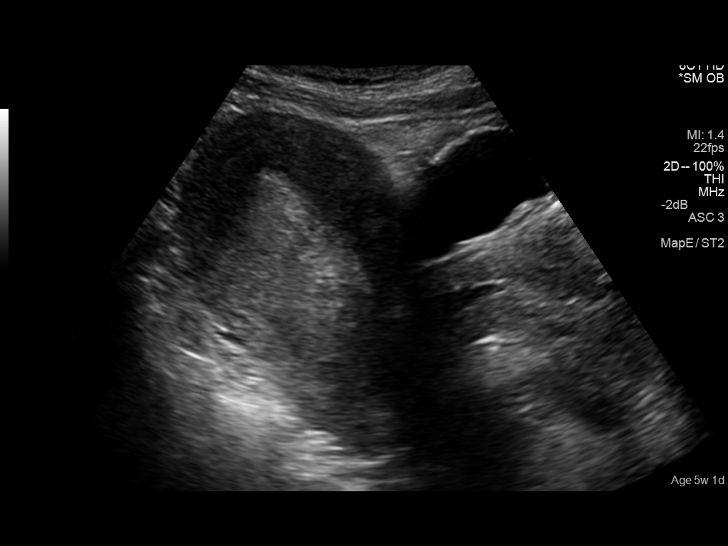
[im 11/60]
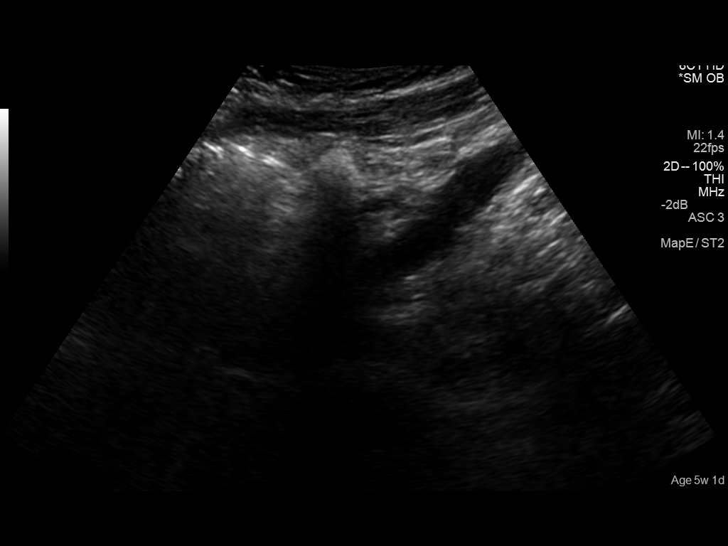
[im 16/60]
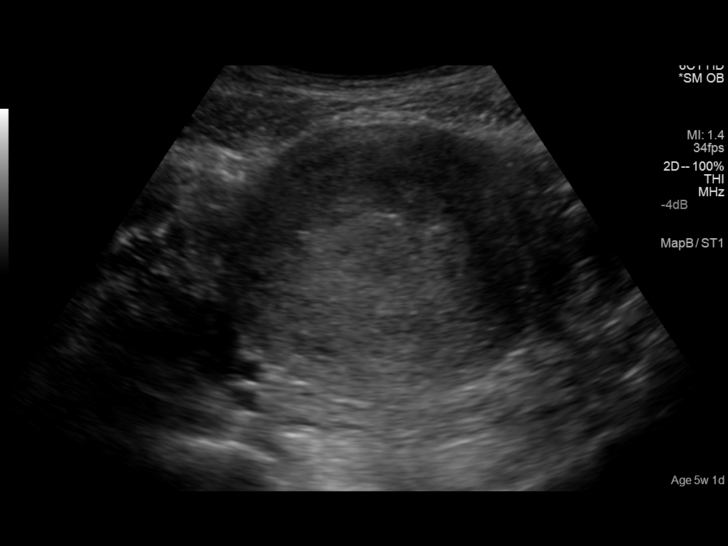
[im 20/60]
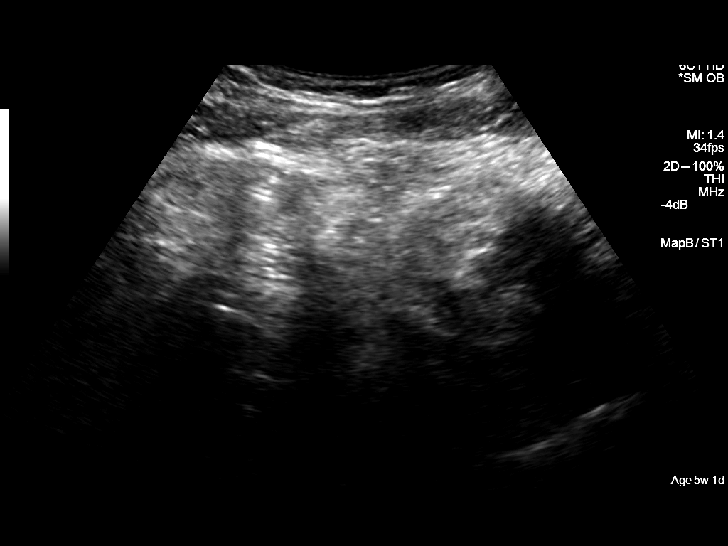
[im 25/60]
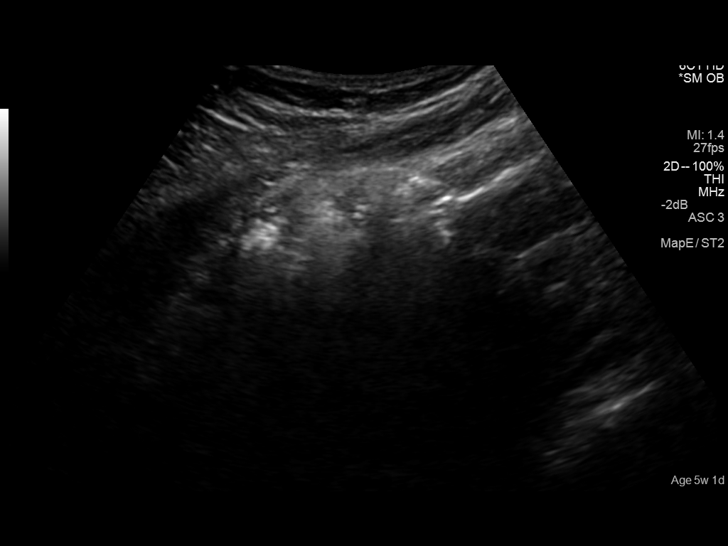
[im 31/60]
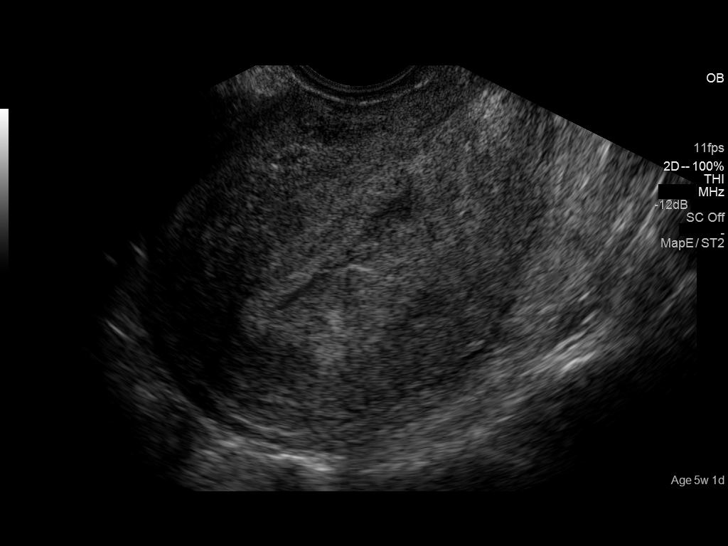
[im 35/60]
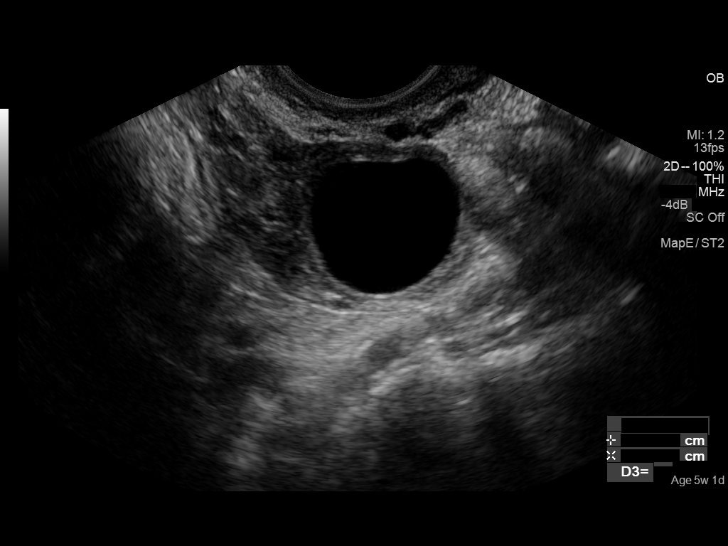
[im 40/60]
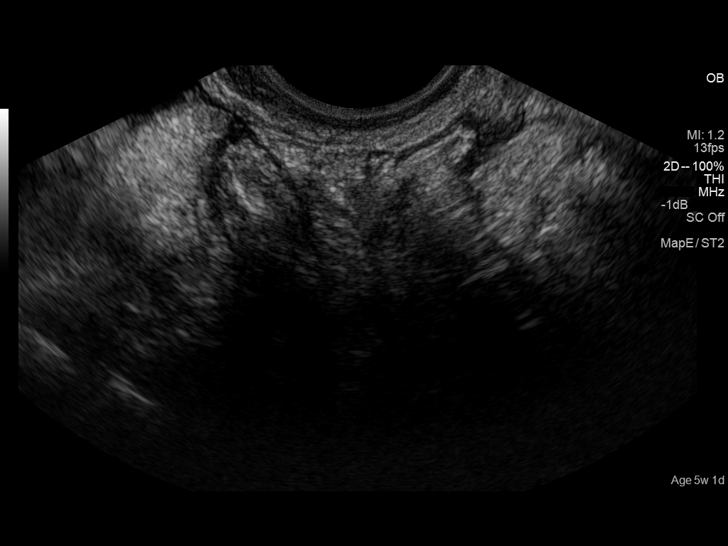
[im 44/60]
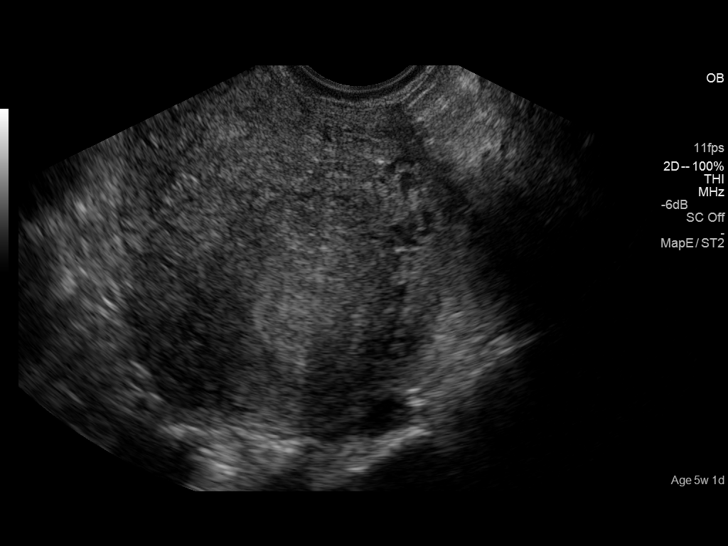
[im 49/60]
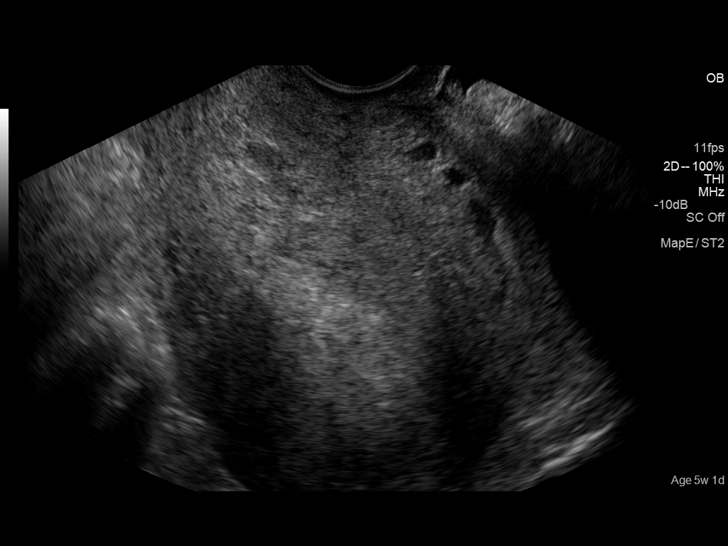
[im 53/60]
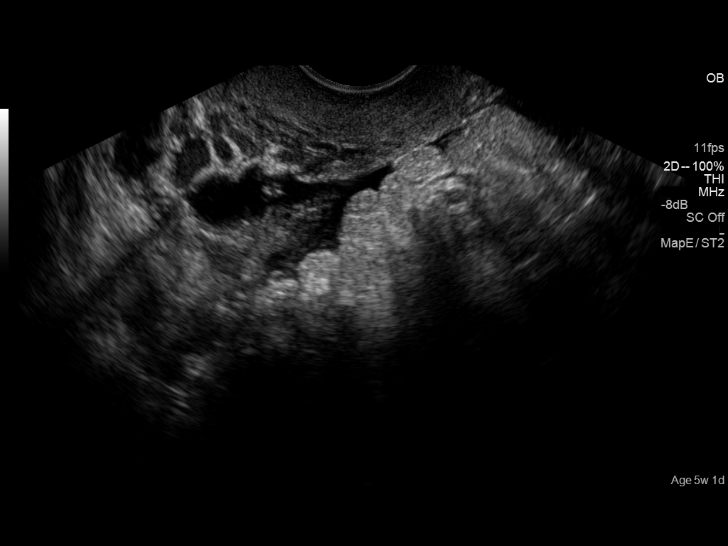
[im 57/60]
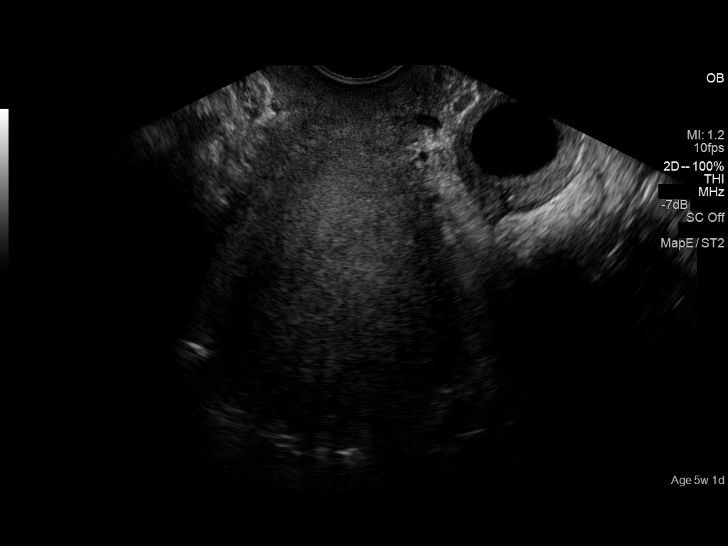

[13 of 28 positions shown; findings below may reference images not displayed]

FINDINGS: Intrauterine gestational sac: None seen.

Yolk sac:  N/A

Embryo:  N/A

Maternal uterus/adnexae: The endometrial echo complex is thickened
and heterogeneous, with a small amount of associated fluid likely
reflecting blood. No retained products of conception are seen. Per
clinical correlation, the patient passed products of conception
while in the ER.

The right ovary is not visualized. The left ovary is unremarkable in
appearance, measuring 3.4 x 2.5 x 2.9 cm. No suspicious adnexal
masses are seen; there is no evidence for ovarian torsion.

No free fluid is seen within the pelvic cul-de-sac.
IMPRESSION: No intrauterine gestational sac seen. No evidence for ectopic
pregnancy. Small amount of blood noted within the endometrial canal.
No evidence of retained products of conception. Per clinical
correlation, the patient passed products of conception while in the
ER.

## 2017-03-15 ENCOUNTER — Ambulatory Visit (INDEPENDENT_AMBULATORY_CARE_PROVIDER_SITE_OTHER): Payer: 59 | Admitting: Certified Nurse Midwife

## 2017-03-15 ENCOUNTER — Encounter: Payer: Self-pay | Admitting: Certified Nurse Midwife

## 2017-03-15 VITALS — BP 78/41 | HR 78 | Wt 181.6 lb

## 2017-03-15 DIAGNOSIS — Z3483 Encounter for supervision of other normal pregnancy, third trimester: Secondary | ICD-10-CM

## 2017-03-15 LAB — POCT URINALYSIS DIPSTICK
Bilirubin, UA: NEGATIVE
Blood, UA: NEGATIVE
Glucose, UA: NEGATIVE
Ketones, UA: NEGATIVE
Leukocytes, UA: NEGATIVE
Nitrite, UA: NEGATIVE
Protein, UA: NEGATIVE
Spec Grav, UA: 1.01
Urobilinogen, UA: 0.2 U/dL
pH, UA: 7

## 2017-03-15 NOTE — Patient Instructions (Signed)

## 2017-03-15 NOTE — Progress Notes (Signed)
ROB, doing well. She states that she has restless leg and has had it in the past. Encouraged use of compression stocking to help. Discussed cord blood donation, labor plan , and birth classes. She states that she want to use nitrous for pain. She used with her last baby and it worked well for her. Reviewed birth classes. She is considering the sibling class for her 89 yr old. She denies LOF, vag bleeding, and contractions. Follow up in 2 wks.    Doreene BurkeAnnie Josanna Hefel, CNM

## 2017-03-15 NOTE — Progress Notes (Signed)
Rob- RLS- tylenol pm not helping.

## 2017-03-29 ENCOUNTER — Ambulatory Visit (INDEPENDENT_AMBULATORY_CARE_PROVIDER_SITE_OTHER): Payer: 59 | Admitting: Certified Nurse Midwife

## 2017-03-29 VITALS — BP 93/53 | HR 92 | Wt 184.0 lb

## 2017-03-29 DIAGNOSIS — Z3493 Encounter for supervision of normal pregnancy, unspecified, third trimester: Secondary | ICD-10-CM

## 2017-03-29 LAB — POCT URINALYSIS DIPSTICK
Bilirubin, UA: NEGATIVE
Glucose, UA: NEGATIVE
KETONES UA: NEGATIVE
Leukocytes, UA: NEGATIVE
Nitrite, UA: NEGATIVE
PH UA: 7.5 (ref 5.0–8.0)
PROTEIN UA: NEGATIVE
Spec Grav, UA: <=1.005 — AB (ref 1.010–1.025)
UROBILINOGEN UA: 0.2 U/dL

## 2017-03-29 NOTE — Patient Instructions (Signed)
Pregnancy and Anemia Anemia is a condition in which the concentration of red blood cells or hemoglobin in the blood is below normal. Hemoglobin is a substance in red blood cells that carries oxygen to the tissues of the body. Anemia results in not enough oxygen reaching these tissues. Anemia during pregnancy is common because the fetus uses more iron and folic acid as it is developing. Your body may not produce enough red blood cells because of this. Also, during pregnancy, the liquid part of the blood (plasma) increases by about 50%, and the red blood cells increase by only 25%. This lowers the concentration of the red blood cells and creates a natural anemia-like situation. What are the causes? The most common cause of anemia during pregnancy is not having enough iron in the body to make red blood cells (iron deficiency anemia). Other causes may include:  Folic acid deficiency.  Vitamin B12 deficiency.  Certain prescription or over-the-counter medicines.  Certain medical conditions or infections that destroy red blood cells.  A low platelet count and bleeding caused by antibodies that go through the placenta to the fetus from the mother's blood. What are the signs or symptoms? Mild anemia may not be noticeable. If it becomes severe, symptoms may include:  Tiredness.  Shortness of breath, especially with exercise.  Weakness.  Fainting.  Pale looking skin.  Headaches.  Feeling a fast or irregular heartbeat (palpitations). How is this diagnosed? The type of anemia is usually diagnosed from your family and medical history and blood tests. How is this treated? Treatment of anemia during pregnancy depends on the cause of the anemia. Treatment can include:  Supplements of iron, vitamin B12, or folic acid.  A blood transfusion. This may be needed if blood loss is severe.  Hospitalization. This may be needed if there is significant continual blood loss.  Dietary changes. Follow  these instructions at home:  Follow your dietitian's or health care provider's dietary recommendations.  Increase your vitamin C intake. This will help the stomach absorb more iron.  Eat a diet rich in iron. This would include foods such as:  Liver.  Beef.  Whole grain bread.  Eggs.  Dried fruit.  Take iron and vitamins as directed by your health care provider.  Eat green leafy vegetables. These are a good source of folic acid. Contact a health care provider if:  You have frequent or lasting headaches.  You are looking pale.  You are bruising easily. Get help right away if:  You have extreme weakness, shortness of breath, or chest pain.  You become dizzy or have trouble concentrating.  You have heavy vaginal bleeding.  You develop a rash.  You have bloody or black, tarry stools.  You faint.  You vomit up blood.  You vomit repeatedly.  You have abdominal pain.  You have a fever or persistent symptoms for more than 2-3 days.  You have a fever and your symptoms suddenly get worse.  You are dehydrated. This information is not intended to replace advice given to you by your health care provider. Make sure you discuss any questions you have with your health care provider. Document Released: 09/21/2000 Document Revised: 03/01/2016 Document Reviewed: 05/06/2013 Elsevier Interactive Patient Education  2017 Elsevier Inc.  

## 2017-03-29 NOTE — Progress Notes (Signed)
ROB, doing well. No complaints. Endorses good fetal movement. Denies LOF, vaginal bleeding. Follow up in 2 wks.   Doreene BurkeAnnie Karimah Winquist, CNM

## 2017-04-12 ENCOUNTER — Encounter: Payer: Self-pay | Admitting: Certified Nurse Midwife

## 2017-04-12 ENCOUNTER — Ambulatory Visit (INDEPENDENT_AMBULATORY_CARE_PROVIDER_SITE_OTHER): Payer: 59 | Admitting: Certified Nurse Midwife

## 2017-04-12 VITALS — BP 103/58 | HR 88 | Wt 181.7 lb

## 2017-04-12 DIAGNOSIS — Z3493 Encounter for supervision of normal pregnancy, unspecified, third trimester: Secondary | ICD-10-CM

## 2017-04-12 LAB — POCT URINALYSIS DIPSTICK
Bilirubin, UA: NEGATIVE
GLUCOSE UA: NEGATIVE
Ketones, UA: NEGATIVE
Leukocytes, UA: NEGATIVE
NITRITE UA: NEGATIVE
Protein, UA: NEGATIVE
RBC UA: NEGATIVE
Spec Grav, UA: 1.01 (ref 1.010–1.025)
Urobilinogen, UA: 0.2 E.U./dL
pH, UA: 7 (ref 5.0–8.0)

## 2017-04-12 NOTE — Progress Notes (Signed)
ROB-Pt doing well, no questions or concerns. Plans to continue working for the next three (3) weeks. Anticipatory guidance regarding 36 week cultures and course of prenatal care. Discussed red flag symptoms and when to call. RTC x 2 weeks for 36 week cultures and ROB with Pattricia BossAnnie.

## 2017-04-12 NOTE — Patient Instructions (Signed)
Vaginal Delivery Vaginal delivery means that you will give birth by pushing your baby out of your birth canal (vagina). A team of health care providers will help you before, during, and after vaginal delivery. Birth experiences are unique for every woman and every pregnancy, and birth experiences vary depending on where you choose to give birth. What should I do to prepare for my baby's birth? Before your baby is born, it is important to talk with your health care provider about:  Your labor and delivery preferences. These may include: ? Medicines that you may be given. ? How you will manage your pain. This might include non-medical pain relief techniques or injectable pain relief such as epidural analgesia. ? How you and your baby will be monitored during labor and delivery. ? Who may be in the labor and delivery room with you. ? Your feelings about surgical delivery of your baby (cesarean delivery, or C-section) if this becomes necessary. ? Your feelings about receiving donated blood through an IV tube (blood transfusion) if this becomes necessary.  Whether you are able: ? To take pictures or videos of the birth. ? To eat during labor and delivery. ? To move around, walk, or change positions during labor and delivery.  What to expect after your baby is born, such as: ? Whether delayed umbilical cord clamping and cutting is offered. ? Who will care for your baby right after birth. ? Medicines or tests that may be recommended for your baby. ? Whether breastfeeding is supported in your hospital or birth center. ? How long you will be in the hospital or birth center.  How any medical conditions you have may affect your baby or your labor and delivery experience.  To prepare for your baby's birth, you should also:  Attend all of your health care visits before delivery (prenatal visits) as recommended by your health care provider. This is important.  Prepare your home for your baby's  arrival. Make sure that you have: ? Diapers. ? Baby clothing. ? Feeding equipment. ? Safe sleeping arrangements for you and your baby.  Install a car seat in your vehicle. Have your car seat checked by a certified car seat installer to make sure that it is installed safely.  Think about who will help you with your new baby at home for at least the first several weeks after delivery.  What can I expect when I arrive at the birth center or hospital? Once you are in labor and have been admitted into the hospital or birth center, your health care provider may:  Review your pregnancy history and any concerns you have.  Insert an IV tube into one of your veins. This is used to give you fluids and medicines.  Check your blood pressure, pulse, temperature, and heart rate (vital signs).  Check whether your bag of water (amniotic sac) has broken (ruptured).  Talk with you about your birth plan and discuss pain control options.  Monitoring Your health care provider may monitor your contractions (uterine monitoring) and your baby's heart rate (fetal monitoring). You may need to be monitored:  Often, but not continuously (intermittently).  All the time or for long periods at a time (continuously). Continuous monitoring may be needed if: ? You are taking certain medicines, such as medicine to relieve pain or make your contractions stronger. ? You have pregnancy or labor complications.  Monitoring may be done by:  Placing a special stethoscope or a handheld monitoring device on your abdomen to   check your baby's heartbeat, and feeling your abdomen for contractions. This method of monitoring does not continuously record your baby's heartbeat or your contractions.  Placing monitors on your abdomen (external monitors) to record your baby's heartbeat and the frequency and length of contractions. You may not have to wear external monitors all the time.  Placing monitors inside of your uterus  (internal monitors) to record your baby's heartbeat and the frequency, length, and strength of your contractions. ? Your health care provider may use internal monitors if he or she needs more information about the strength of your contractions or your baby's heart rate. ? Internal monitors are put in place by passing a thin, flexible wire through your vagina and into your uterus. Depending on the type of monitor, it may remain in your uterus or on your baby's head until birth. ? Your health care provider will discuss the benefits and risks of internal monitoring with you and will ask for your permission before inserting the monitors.  Telemetry. This is a type of continuous monitoring that can be done with external or internal monitors. Instead of having to stay in bed, you are able to move around during telemetry. Ask your health care provider if telemetry is an option for you.  Physical exam Your health care provider may perform a physical exam. This may include:  Checking whether your baby is positioned: ? With the head toward your vagina (head-down). This is most common. ? With the head toward the top of your uterus (head-up or breech). If your baby is in a breech position, your health care provider may try to turn your baby to a head-down position so you can deliver vaginally. If it does not seem that your baby can be born vaginally, your provider may recommend surgery to deliver your baby. In rare cases, you may be able to deliver vaginally if your baby is head-up (breech delivery). ? Lying sideways (transverse). Babies that are lying sideways cannot be delivered vaginally.  Checking your cervix to determine: ? Whether it is thinning out (effacing). ? Whether it is opening up (dilating). ? How low your baby has moved into your birth canal.  What are the three stages of labor and delivery?  Normal labor and delivery is divided into the following three stages: Stage 1  Stage 1 is the  longest stage of labor, and it can last for hours or days. Stage 1 includes: ? Early labor. This is when contractions may be irregular, or regular and mild. Generally, early labor contractions are more than 10 minutes apart. ? Active labor. This is when contractions get longer, more regular, more frequent, and more intense. ? The transition phase. This is when contractions happen very close together, are very intense, and may last longer than during any other part of labor.  Contractions generally feel mild, infrequent, and irregular at first. They get stronger, more frequent (about every 2-3 minutes), and more regular as you progress from early labor through active labor and transition.  Many women progress through stage 1 naturally, but you may need help to continue making progress. If this happens, your health care provider may talk with you about: ? Rupturing your amniotic sac if it has not ruptured yet. ? Giving you medicine to help make your contractions stronger and more frequent.  Stage 1 ends when your cervix is completely dilated to 4 inches (10 cm) and completely effaced. This happens at the end of the transition phase. Stage 2  Once   your cervix is completely effaced and dilated to 4 inches (10 cm), you may start to feel an urge to push. It is common for the body to naturally take a rest before feeling the urge to push, especially if you received an epidural or certain other pain medicines. This rest period may last for up to 1-2 hours, depending on your unique labor experience.  During stage 2, contractions are generally less painful, because pushing helps relieve contraction pain. Instead of contraction pain, you may feel stretching and burning pain, especially when the widest part of your baby's head passes through the vaginal opening (crowning).  Your health care provider will closely monitor your pushing progress and your baby's progress through the vagina during stage 2.  Your  health care provider may massage the area of skin between your vaginal opening and anus (perineum) or apply warm compresses to your perineum. This helps it stretch as the baby's head starts to crown, which can help prevent perineal tearing. ? In some cases, an incision may be made in your perineum (episiotomy) to allow the baby to pass through the vaginal opening. An episiotomy helps to make the opening of the vagina larger to allow more room for the baby to fit through.  It is very important to breathe and focus so your health care provider can control the delivery of your baby's head. Your health care provider may have you decrease the intensity of your pushing, to help prevent perineal tearing.  After delivery of your baby's head, the shoulders and the rest of the body generally deliver very quickly and without difficulty.  Once your baby is delivered, the umbilical cord may be cut right away, or this may be delayed for 1-2 minutes, depending on your baby's health. This may vary among health care providers, hospitals, and birth centers.  If you and your baby are healthy enough, your baby may be placed on your chest or abdomen to help maintain the baby's temperature and to help you bond with each other. Some mothers and babies start breastfeeding at this time. Your health care team will dry your baby and help keep your baby warm during this time.  Your baby may need immediate care if he or she: ? Showed signs of distress during labor. ? Has a medical condition. ? Was born too early (prematurely). ? Had a bowel movement before birth (meconium). ? Shows signs of difficulty transitioning from being inside the uterus to being outside of the uterus. If you are planning to breastfeed, your health care team will help you begin a feeding. Stage 3  The third stage of labor starts immediately after the birth of your baby and ends after you deliver the placenta. The placenta is an organ that develops  during pregnancy to provide oxygen and nutrients to your baby in the womb.  Delivering the placenta may require some pushing, and you may have mild contractions. Breastfeeding can stimulate contractions to help you deliver the placenta.  After the placenta is delivered, your uterus should tighten (contract) and become firm. This helps to stop bleeding in your uterus. To help your uterus contract and to control bleeding, your health care provider may: ? Give you medicine by injection, through an IV tube, by mouth, or through your rectum (rectally). ? Massage your abdomen or perform a vaginal exam to remove any blood clots that are left in your uterus. ? Empty your bladder by placing a thin, flexible tube (catheter) into your bladder. ? Encourage   you to breastfeed your baby. After labor is over, you and your baby will be monitored closely to ensure that you are both healthy until you are ready to go home. Your health care team will teach you how to care for yourself and your baby. This information is not intended to replace advice given to you by your health care provider. Make sure you discuss any questions you have with your health care provider. Document Released: 07/03/2008 Document Revised: 04/13/2016 Document Reviewed: 10/09/2015 Elsevier Interactive Patient Education  2018 [employer]  

## 2017-04-12 NOTE — Progress Notes (Signed)
ROB- no complaints.  

## 2017-04-26 ENCOUNTER — Ambulatory Visit (INDEPENDENT_AMBULATORY_CARE_PROVIDER_SITE_OTHER): Payer: 59 | Admitting: Certified Nurse Midwife

## 2017-04-26 VITALS — BP 103/66 | HR 85 | Wt 190.0 lb

## 2017-04-26 DIAGNOSIS — Z3493 Encounter for supervision of normal pregnancy, unspecified, third trimester: Secondary | ICD-10-CM

## 2017-04-26 DIAGNOSIS — Z3480 Encounter for supervision of other normal pregnancy, unspecified trimester: Secondary | ICD-10-CM

## 2017-04-26 LAB — POCT URINALYSIS DIPSTICK
BILIRUBIN UA: NEGATIVE
Blood, UA: NEGATIVE
GLUCOSE UA: NEGATIVE
Ketones, UA: NEGATIVE
LEUKOCYTES UA: NEGATIVE
Nitrite, UA: NEGATIVE
Protein, UA: NEGATIVE
Spec Grav, UA: 1.01 (ref 1.010–1.025)
Urobilinogen, UA: 0.2 E.U./dL
pH, UA: 6.5 (ref 5.0–8.0)

## 2017-04-26 LAB — OB RESULTS CONSOLE GBS: GBS: NEGATIVE

## 2017-04-26 NOTE — Progress Notes (Signed)
ROB-No complaints.  

## 2017-04-26 NOTE — Patient Instructions (Signed)

## 2017-04-26 NOTE — Progress Notes (Signed)
ROB, doing well. No complaints. GBS and cultures today . SVE per pt request. Labor precautions reviewed.   Doreene BurkeAnnie Detroit Frieden, CNM

## 2017-04-30 ENCOUNTER — Encounter: Payer: Self-pay | Admitting: Certified Nurse Midwife

## 2017-04-30 LAB — CULTURE, BETA STREP (GROUP B ONLY): Strep Gp B Culture: NEGATIVE

## 2017-05-02 LAB — NUSWAB VAGINITIS PLUS (VG+)
Candida albicans, NAA: NEGATIVE
Candida glabrata, NAA: NEGATIVE
Chlamydia trachomatis, NAA: NEGATIVE
Neisseria gonorrhoeae, NAA: NEGATIVE
Trich vag by NAA: NEGATIVE

## 2017-05-03 ENCOUNTER — Encounter: Payer: Self-pay | Admitting: Certified Nurse Midwife

## 2017-05-03 ENCOUNTER — Ambulatory Visit (INDEPENDENT_AMBULATORY_CARE_PROVIDER_SITE_OTHER): Payer: 59 | Admitting: Obstetrics and Gynecology

## 2017-05-03 VITALS — BP 97/59 | HR 75 | Wt 187.2 lb

## 2017-05-03 DIAGNOSIS — Z3493 Encounter for supervision of normal pregnancy, unspecified, third trimester: Secondary | ICD-10-CM

## 2017-05-03 LAB — POCT URINALYSIS DIPSTICK
BILIRUBIN UA: NEGATIVE
Blood, UA: NEGATIVE
GLUCOSE UA: NEGATIVE
Ketones, UA: NEGATIVE
Leukocytes, UA: NEGATIVE
Nitrite, UA: NEGATIVE
Protein, UA: NEGATIVE
Spec Grav, UA: 1.01 (ref 1.010–1.025)
UROBILINOGEN UA: 0.2 U/dL
pH, UA: 6.5 (ref 5.0–8.0)

## 2017-05-03 NOTE — Progress Notes (Signed)
ROB- pt is having a lot of pelvic pressure, some contractions 

## 2017-05-03 NOTE — Progress Notes (Signed)
Rob-Discussed negative cultures and labor precautions.

## 2017-05-10 ENCOUNTER — Ambulatory Visit (INDEPENDENT_AMBULATORY_CARE_PROVIDER_SITE_OTHER): Payer: 59 | Admitting: Certified Nurse Midwife

## 2017-05-10 VITALS — BP 112/64 | HR 89 | Wt 186.7 lb

## 2017-05-10 DIAGNOSIS — O09523 Supervision of elderly multigravida, third trimester: Secondary | ICD-10-CM

## 2017-05-10 LAB — POCT URINALYSIS DIPSTICK
BILIRUBIN UA: NEGATIVE
Glucose, UA: NEGATIVE
Ketones, UA: 40
Leukocytes, UA: NEGATIVE
Nitrite, UA: NEGATIVE
Protein, UA: NEGATIVE
Spec Grav, UA: 1.015 (ref 1.010–1.025)
Urobilinogen, UA: 0.2 E.U./dL
pH, UA: 6.5 (ref 5.0–8.0)

## 2017-05-10 NOTE — Patient Instructions (Signed)
Vaginal Delivery Vaginal delivery means that you will give birth by pushing your baby out of your birth canal (vagina). A team of health care providers will help you before, during, and after vaginal delivery. Birth experiences are unique for every woman and every pregnancy, and birth experiences vary depending on where you choose to give birth. What should I do to prepare for my baby's birth? Before your baby is born, it is important to talk with your health care provider about:  Your labor and delivery preferences. These may include: ? Medicines that you may be given. ? How you will manage your pain. This might include non-medical pain relief techniques or injectable pain relief such as epidural analgesia. ? How you and your baby will be monitored during labor and delivery. ? Who may be in the labor and delivery room with you. ? Your feelings about surgical delivery of your baby (cesarean delivery, or C-section) if this becomes necessary. ? Your feelings about receiving donated blood through an IV tube (blood transfusion) if this becomes necessary.  Whether you are able: ? To take pictures or videos of the birth. ? To eat during labor and delivery. ? To move around, walk, or change positions during labor and delivery.  What to expect after your baby is born, such as: ? Whether delayed umbilical cord clamping and cutting is offered. ? Who will care for your baby right after birth. ? Medicines or tests that may be recommended for your baby. ? Whether breastfeeding is supported in your hospital or birth center. ? How long you will be in the hospital or birth center.  How any medical conditions you have may affect your baby or your labor and delivery experience.  To prepare for your baby's birth, you should also:  Attend all of your health care visits before delivery (prenatal visits) as recommended by your health care provider. This is important.  Prepare your home for your baby's  arrival. Make sure that you have: ? Diapers. ? Baby clothing. ? Feeding equipment. ? Safe sleeping arrangements for you and your baby.  Install a car seat in your vehicle. Have your car seat checked by a certified car seat installer to make sure that it is installed safely.  Think about who will help you with your new baby at home for at least the first several weeks after delivery.  What can I expect when I arrive at the birth center or hospital? Once you are in labor and have been admitted into the hospital or birth center, your health care provider may:  Review your pregnancy history and any concerns you have.  Insert an IV tube into one of your veins. This is used to give you fluids and medicines.  Check your blood pressure, pulse, temperature, and heart rate (vital signs).  Check whether your bag of water (amniotic sac) has broken (ruptured).  Talk with you about your birth plan and discuss pain control options.  Monitoring Your health care provider may monitor your contractions (uterine monitoring) and your baby's heart rate (fetal monitoring). You may need to be monitored:  Often, but not continuously (intermittently).  All the time or for long periods at a time (continuously). Continuous monitoring may be needed if: ? You are taking certain medicines, such as medicine to relieve pain or make your contractions stronger. ? You have pregnancy or labor complications.  Monitoring may be done by:  Placing a special stethoscope or a handheld monitoring device on your abdomen to   check your baby's heartbeat, and feeling your abdomen for contractions. This method of monitoring does not continuously record your baby's heartbeat or your contractions.  Placing monitors on your abdomen (external monitors) to record your baby's heartbeat and the frequency and length of contractions. You may not have to wear external monitors all the time.  Placing monitors inside of your uterus  (internal monitors) to record your baby's heartbeat and the frequency, length, and strength of your contractions. ? Your health care provider may use internal monitors if he or she needs more information about the strength of your contractions or your baby's heart rate. ? Internal monitors are put in place by passing a thin, flexible wire through your vagina and into your uterus. Depending on the type of monitor, it may remain in your uterus or on your baby's head until birth. ? Your health care provider will discuss the benefits and risks of internal monitoring with you and will ask for your permission before inserting the monitors.  Telemetry. This is a type of continuous monitoring that can be done with external or internal monitors. Instead of having to stay in bed, you are able to move around during telemetry. Ask your health care provider if telemetry is an option for you.  Physical exam Your health care provider may perform a physical exam. This may include:  Checking whether your baby is positioned: ? With the head toward your vagina (head-down). This is most common. ? With the head toward the top of your uterus (head-up or breech). If your baby is in a breech position, your health care provider may try to turn your baby to a head-down position so you can deliver vaginally. If it does not seem that your baby can be born vaginally, your provider may recommend surgery to deliver your baby. In rare cases, you may be able to deliver vaginally if your baby is head-up (breech delivery). ? Lying sideways (transverse). Babies that are lying sideways cannot be delivered vaginally.  Checking your cervix to determine: ? Whether it is thinning out (effacing). ? Whether it is opening up (dilating). ? How low your baby has moved into your birth canal.  What are the three stages of labor and delivery?  Normal labor and delivery is divided into the following three stages: Stage 1  Stage 1 is the  longest stage of labor, and it can last for hours or days. Stage 1 includes: ? Early labor. This is when contractions may be irregular, or regular and mild. Generally, early labor contractions are more than 10 minutes apart. ? Active labor. This is when contractions get longer, more regular, more frequent, and more intense. ? The transition phase. This is when contractions happen very close together, are very intense, and may last longer than during any other part of labor.  Contractions generally feel mild, infrequent, and irregular at first. They get stronger, more frequent (about every 2-3 minutes), and more regular as you progress from early labor through active labor and transition.  Many women progress through stage 1 naturally, but you may need help to continue making progress. If this happens, your health care provider may talk with you about: ? Rupturing your amniotic sac if it has not ruptured yet. ? Giving you medicine to help make your contractions stronger and more frequent.  Stage 1 ends when your cervix is completely dilated to 4 inches (10 cm) and completely effaced. This happens at the end of the transition phase. Stage 2  Once   your cervix is completely effaced and dilated to 4 inches (10 cm), you may start to feel an urge to push. It is common for the body to naturally take a rest before feeling the urge to push, especially if you received an epidural or certain other pain medicines. This rest period may last for up to 1-2 hours, depending on your unique labor experience.  During stage 2, contractions are generally less painful, because pushing helps relieve contraction pain. Instead of contraction pain, you may feel stretching and burning pain, especially when the widest part of your baby's head passes through the vaginal opening (crowning).  Your health care provider will closely monitor your pushing progress and your baby's progress through the vagina during stage 2.  Your  health care provider may massage the area of skin between your vaginal opening and anus (perineum) or apply warm compresses to your perineum. This helps it stretch as the baby's head starts to crown, which can help prevent perineal tearing. ? In some cases, an incision may be made in your perineum (episiotomy) to allow the baby to pass through the vaginal opening. An episiotomy helps to make the opening of the vagina larger to allow more room for the baby to fit through.  It is very important to breathe and focus so your health care provider can control the delivery of your baby's head. Your health care provider may have you decrease the intensity of your pushing, to help prevent perineal tearing.  After delivery of your baby's head, the shoulders and the rest of the body generally deliver very quickly and without difficulty.  Once your baby is delivered, the umbilical cord may be cut right away, or this may be delayed for 1-2 minutes, depending on your baby's health. This may vary among health care providers, hospitals, and birth centers.  If you and your baby are healthy enough, your baby may be placed on your chest or abdomen to help maintain the baby's temperature and to help you bond with each other. Some mothers and babies start breastfeeding at this time. Your health care team will dry your baby and help keep your baby warm during this time.  Your baby may need immediate care if he or she: ? Showed signs of distress during labor. ? Has a medical condition. ? Was born too early (prematurely). ? Had a bowel movement before birth (meconium). ? Shows signs of difficulty transitioning from being inside the uterus to being outside of the uterus. If you are planning to breastfeed, your health care team will help you begin a feeding. Stage 3  The third stage of labor starts immediately after the birth of your baby and ends after you deliver the placenta. The placenta is an organ that develops  during pregnancy to provide oxygen and nutrients to your baby in the womb.  Delivering the placenta may require some pushing, and you may have mild contractions. Breastfeeding can stimulate contractions to help you deliver the placenta.  After the placenta is delivered, your uterus should tighten (contract) and become firm. This helps to stop bleeding in your uterus. To help your uterus contract and to control bleeding, your health care provider may: ? Give you medicine by injection, through an IV tube, by mouth, or through your rectum (rectally). ? Massage your abdomen or perform a vaginal exam to remove any blood clots that are left in your uterus. ? Empty your bladder by placing a thin, flexible tube (catheter) into your bladder. ? Encourage   you to breastfeed your baby. After labor is over, you and your baby will be monitored closely to ensure that you are both healthy until you are ready to go home. Your health care team will teach you how to care for yourself and your baby. This information is not intended to replace advice given to you by your health care provider. Make sure you discuss any questions you have with your health care provider. Document Released: 07/03/2008 Document Revised: 04/13/2016 Document Reviewed: 10/09/2015 Elsevier Interactive Patient Education  2018 [employer]  

## 2017-05-10 NOTE — Progress Notes (Signed)
ROB-Pt doing well, reports irregular contractions. Discussed home preparation measures. Reviewed red flag symptoms and when to call. RTC x 1 week for ROB or sooner if needed.

## 2017-05-17 ENCOUNTER — Ambulatory Visit (INDEPENDENT_AMBULATORY_CARE_PROVIDER_SITE_OTHER): Payer: 59 | Admitting: Certified Nurse Midwife

## 2017-05-17 ENCOUNTER — Encounter: Payer: Self-pay | Admitting: Certified Nurse Midwife

## 2017-05-17 VITALS — BP 97/62 | HR 104 | Wt 185.0 lb

## 2017-05-17 DIAGNOSIS — O09523 Supervision of elderly multigravida, third trimester: Secondary | ICD-10-CM

## 2017-05-17 LAB — POCT URINALYSIS DIPSTICK
Bilirubin, UA: NEGATIVE
Blood, UA: NEGATIVE
Glucose, UA: NEGATIVE
KETONES UA: NEGATIVE
LEUKOCYTES UA: NEGATIVE
Nitrite, UA: NEGATIVE
Protein, UA: NEGATIVE
Spec Grav, UA: 1.015 (ref 1.010–1.025)
Urobilinogen, UA: 0.2 E.U./dL
pH, UA: 5 (ref 5.0–8.0)

## 2017-05-17 NOTE — Progress Notes (Signed)
ROB, doing well. She has irregulare contractions. NO LOF. Discussed plan of care. Induction scheduled for Monday 13th ( elective) reviewed possibility of being bumped. SVE 4-5 cm. IF induction bumped she will follow up in office one wk.  Doreene BurkeAnnie Kerstyn Coryell, CNM

## 2017-05-17 NOTE — Progress Notes (Signed)
Pt is here for a routine OB visit. C/o "feels like a pulled muscle lower right side".

## 2017-05-17 NOTE — Patient Instructions (Signed)

## 2017-05-20 ENCOUNTER — Inpatient Hospital Stay
Admission: EM | Admit: 2017-05-20 | Discharge: 2017-05-21 | DRG: 775 | Disposition: A | Discharge status: Home / Self Care | Payer: 59 | Attending: Obstetrics and Gynecology | Admitting: Obstetrics and Gynecology

## 2017-05-20 DIAGNOSIS — O26893 Other specified pregnancy related conditions, third trimester: Secondary | ICD-10-CM | POA: Diagnosis present

## 2017-05-20 DIAGNOSIS — J45909 Unspecified asthma, uncomplicated: Secondary | ICD-10-CM | POA: Diagnosis present

## 2017-05-20 DIAGNOSIS — O9952 Diseases of the respiratory system complicating childbirth: Secondary | ICD-10-CM | POA: Diagnosis present

## 2017-05-20 DIAGNOSIS — Z3A39 39 weeks gestation of pregnancy: Secondary | ICD-10-CM

## 2017-05-20 DIAGNOSIS — O99334 Smoking (tobacco) complicating childbirth: Secondary | ICD-10-CM | POA: Diagnosis present

## 2017-05-20 LAB — CBC
HEMATOCRIT: 36.5 % (ref 35.0–47.0)
HEMOGLOBIN: 12.8 g/dL (ref 12.0–16.0)
MCH: 33.2 pg (ref 26.0–34.0)
MCHC: 35.2 g/dL (ref 32.0–36.0)
MCV: 94.5 fL (ref 80.0–100.0)
Platelets: 246 10*3/uL (ref 150–440)
RBC: 3.86 MIL/uL (ref 3.80–5.20)
RDW: 14.6 % — ABNORMAL HIGH (ref 11.5–14.5)
WBC: 13.2 10*3/uL — ABNORMAL HIGH (ref 3.6–11.0)

## 2017-05-20 LAB — TYPE AND SCREEN
ABO/RH(D): O POS
ANTIBODY SCREEN: NEGATIVE

## 2017-05-20 MED ORDER — SOD CITRATE-CITRIC ACID 500-334 MG/5ML PO SOLN: 30.0000 mL | ORAL | Status: DC | PRN

## 2017-05-20 MED ORDER — ONDANSETRON HCL 4 MG/2ML IJ SOLN: 4.0000 mg | Freq: Four times a day (QID) | INTRAMUSCULAR | Status: DC | PRN

## 2017-05-20 MED ORDER — OXYTOCIN 40 UNITS IN LACTATED RINGERS INFUSION - SIMPLE MED
INTRAVENOUS | Status: AC
  Filled 2017-05-20: qty 1000

## 2017-05-20 MED ORDER — TERBUTALINE SULFATE 1 MG/ML IJ SOLN
0.2500 mg | Freq: Once | INTRAMUSCULAR | Status: DC | PRN
Start: 2017-05-20 — End: 2017-05-20

## 2017-05-20 MED ORDER — DIPHENHYDRAMINE HCL 25 MG PO CAPS: 25.0000 mg | ORAL_CAPSULE | Freq: Four times a day (QID) | ORAL | Status: DC | PRN

## 2017-05-20 MED ORDER — SIMETHICONE 80 MG PO CHEW: 80.0000 mg | CHEWABLE_TABLET | ORAL | Status: DC | PRN

## 2017-05-20 MED ORDER — LACTATED RINGERS IV SOLN
INTRAVENOUS | Status: DC
  Administered 2017-05-20: 1000 mL via INTRAVENOUS

## 2017-05-20 MED ORDER — LIDOCAINE HCL (PF) 1 % IJ SOLN
INTRAMUSCULAR | Status: AC
  Filled 2017-05-20: qty 30

## 2017-05-20 MED ORDER — SENNOSIDES-DOCUSATE SODIUM 8.6-50 MG PO TABS
2.0000 | ORAL_TABLET | ORAL | Status: DC
  Filled 2017-05-20: qty 2

## 2017-05-20 MED ORDER — AMMONIA AROMATIC IN INHA
RESPIRATORY_TRACT | Status: AC
  Filled 2017-05-20: qty 10

## 2017-05-20 MED ORDER — OXYTOCIN BOLUS FROM INFUSION
500.0000 mL | Freq: Once | INTRAVENOUS | Status: AC
  Administered 2017-05-20: 500 mL via INTRAVENOUS

## 2017-05-20 MED ORDER — TETANUS-DIPHTH-ACELL PERTUSSIS 5-2.5-18.5 LF-MCG/0.5 IM SUSP: 0.5000 mL | Freq: Once | INTRAMUSCULAR | Status: AC

## 2017-05-20 MED ORDER — MONTELUKAST SODIUM 10 MG PO TABS
10.0000 mg | ORAL_TABLET | Freq: Every day | ORAL | Status: DC
  Administered 2017-05-20: 10 mg via ORAL
  Filled 2017-05-20 (×3): qty 1

## 2017-05-20 MED ORDER — OXYTOCIN 10 UNIT/ML IJ SOLN
INTRAMUSCULAR | Status: AC
  Filled 2017-05-20: qty 2

## 2017-05-20 MED ORDER — OXYTOCIN 40 UNITS IN LACTATED RINGERS INFUSION - SIMPLE MED
1.0000 m[IU]/min | INTRAVENOUS | Status: DC
  Administered 2017-05-20: 2 m[IU]/min via INTRAVENOUS

## 2017-05-20 MED ORDER — MISOPROSTOL 200 MCG PO TABS
ORAL_TABLET | ORAL | Status: AC
  Filled 2017-05-20: qty 4

## 2017-05-20 MED ORDER — LACTATED RINGERS IV SOLN: 500.0000 mL | INTRAVENOUS | Status: DC | PRN

## 2017-05-20 MED ORDER — OXYCODONE-ACETAMINOPHEN 5-325 MG PO TABS: 2.0000 | ORAL_TABLET | ORAL | Status: DC | PRN

## 2017-05-20 MED ORDER — FERROUS SULFATE 325 (65 FE) MG PO TABS
325.0000 mg | ORAL_TABLET | Freq: Every day | ORAL | Status: DC
  Administered 2017-05-21: 325 mg via ORAL
  Filled 2017-05-20: qty 1

## 2017-05-20 MED ORDER — OXYTOCIN 40 UNITS IN LACTATED RINGERS INFUSION - SIMPLE MED: 2.5000 [IU]/h | INTRAVENOUS | Status: DC

## 2017-05-20 MED ORDER — BUTORPHANOL TARTRATE 2 MG/ML IJ SOLN: 1.0000 mg | INTRAMUSCULAR | Status: DC | PRN

## 2017-05-20 MED ORDER — ACETAMINOPHEN 325 MG PO TABS: 650.0000 mg | ORAL_TABLET | ORAL | Status: DC | PRN

## 2017-05-20 MED ORDER — DIBUCAINE 1 % RE OINT: 1.0000 "application " | TOPICAL_OINTMENT | RECTAL | Status: DC | PRN

## 2017-05-20 MED ORDER — ALBUTEROL SULFATE HFA 108 (90 BASE) MCG/ACT IN AERS: 2.0000 | INHALATION_SPRAY | Freq: Four times a day (QID) | RESPIRATORY_TRACT | Status: DC | PRN

## 2017-05-20 MED ORDER — WITCH HAZEL-GLYCERIN EX PADS: 1.0000 "application " | MEDICATED_PAD | CUTANEOUS | Status: DC | PRN

## 2017-05-20 MED ORDER — OXYCODONE-ACETAMINOPHEN 5-325 MG PO TABS: 1.0000 | ORAL_TABLET | ORAL | Status: DC | PRN

## 2017-05-20 MED ORDER — ONDANSETRON HCL 4 MG/2ML IJ SOLN: 4.0000 mg | INTRAMUSCULAR | Status: DC | PRN

## 2017-05-20 MED ORDER — BENZOCAINE-MENTHOL 20-0.5 % EX AERO: 1.0000 "application " | INHALATION_SPRAY | CUTANEOUS | Status: DC | PRN

## 2017-05-20 MED ORDER — ZOLPIDEM TARTRATE 5 MG PO TABS: 5.0000 mg | ORAL_TABLET | Freq: Every evening | ORAL | Status: DC | PRN

## 2017-05-20 MED ORDER — COCONUT OIL OIL: 1.0000 "application " | TOPICAL_OIL | Status: DC | PRN

## 2017-05-20 MED ORDER — ALBUTEROL SULFATE (2.5 MG/3ML) 0.083% IN NEBU: 2.5000 mg | INHALATION_SOLUTION | Freq: Four times a day (QID) | RESPIRATORY_TRACT | Status: DC | PRN

## 2017-05-20 MED ORDER — PRENATAL MULTIVITAMIN CH
1.0000 | ORAL_TABLET | Freq: Every day | ORAL | Status: DC
  Administered 2017-05-20 – 2017-05-21 (×2): 1 via ORAL
  Filled 2017-05-20 (×2): qty 1

## 2017-05-20 MED ORDER — ONDANSETRON HCL 4 MG PO TABS: 4.0000 mg | ORAL_TABLET | ORAL | Status: DC | PRN

## 2017-05-20 NOTE — H&P (Signed)
History and Physical   HPI  Christine Bowers is a 36 y.o. [id number] at [redacted]w[redacted]d Estimated Date of Delivery: 05/23/17 who is being admitted for induction of labor.    OB History  Obstetric History   G6   P3   T3   P0   A2   L3    SAB1   TAB0   Ectopic1   Multiple0   Live Births3     # Outcome Date GA Lbr Len/2nd Weight Sex Delivery Anes PTL Lv  6 Current           5 Ectopic 04/2016          4 Term 03/26/14 [redacted]w[redacted]d  8 lb 9 oz (3.884 kg) M Vag-Spont   LIV  3 Term 04/29/06 [redacted]w[redacted]d  8 lb 7 oz (3.827 kg) M Vag-Spont   LIV  2 Term 11/08/03 [redacted]w[redacted]d  8 lb 5 oz (3.771 kg) F Vag-Spont   LIV  1 SAB  105w0d           Obstetric Comments  [facility] [employer]  2nd delivery in [place]  3rd delivery at [facility]    PROBLEM LIST  Pregnancy complications or risks: Patient Active Problem List   Diagnosis Date Noted  . Labor and delivery, indication for care 05/20/2017  . Endometritis 05/03/2016  . Asthma 03/25/2014  . Tobacco smoking complicating pregnancy 03/25/2014    Prenatal labs and studies: ABO, Rh: O/Positive/-- (01/19 1052) Antibody: Negative (01/19 1052) Rubella: 1.59 (01/19 1052) RPR: Non Reactive (01/19 1052)  HBsAg: Negative (01/19 1052)  HIV:   non reactive [id number] (07/20 0000) 1 hr Glucola  85 Genetic screening declined Anatomy US normal  Prenatal Transfer Tool  Maternal Diabetes: No Genetic Screening: Declined Maternal Ultrasounds/Referrals: Normal Fetal Ultrasounds or other Referrals:  None Maternal Substance Abuse:  No Significant Maternal Medications:  None Significant Maternal Lab Results: None   Past Medical History:  Diagnosis Date  . Asthma   . Miscarriage      Past Surgical History:  Procedure Laterality Date  . DILATION AND CURETTAGE OF UTERUS    . DILATION AND EVACUATION N/A 05/04/2016   Procedure: DILATATION AND EVACUATION;  Surgeon: Christeen Douglas, MD;  Location: [facility] ORS;  Service: Gynecology;  Laterality: N/A;     Medications    Current  Discharge Medication List    CONTINUE these medications which have NOT CHANGED   Details  albuterol (PROVENTIL HFA;VENTOLIN HFA) 108 (90 Base) MCG/ACT inhaler Inhale 2 puffs into the lungs every 6 (six) hours as needed for wheezing or shortness of breath. Qty: 1 Inhaler, Refills: 2    ferrous sulfate 325 (65 FE) MG tablet Take 325 mg by mouth daily with breakfast.    montelukast (SINGULAIR) 10 MG tablet Take 1 tablet (10 mg total) by mouth at bedtime. Qty: 30 tablet, Refills: 6    Prenatal Vit-Fe Fumarate-FA (PRENATAL VITAMINS) 28-0.8 MG TABS Take 1 tablet by mouth daily.         Allergies  Aspirin and Ibuprofen  Review of Systems  Constitutional: negative Eyes: negative Ears, nose, mouth, throat, and face: negative Respiratory: negative Cardiovascular: negative Gastrointestinal: negative Genitourinary:negative Integument/breast: negative Hematologic/lymphatic: negative Musculoskeletal:negative Neurological: negative Behavioral/Psych: negative Endocrine: negative Allergic/Immunologic: negative  Physical Exam  Ht 5\' 5"  (1.651 m)   Wt 185 lb (83.9 kg)   LMP 08/08/2016   BMI 30.79 kg/m   Lungs:  CTA B Cardio: RRR without M/R/G Abd: Soft, gravid, NT Presentation: cephalic EXT: No C/C/  1+ Edema DTRs: 2+ B CERVIX: in office last week 4-5cm  See Prenatal records for more detailed PE.     FHR:  Baseline: 135 bpm, Variability: Good {> 6 bpm), Accelerations: Reactive and Decelerations: Absent  Toco: Uterine Contractions: occasional a   Test Results  No results found for this or any previous visit (from the past 24 hour(s)). Group B Strep negative  Assessment   [id number] at 1638w4d Estimated Date of Delivery: 05/23/17  The fetus is reassuring.   Patient Active Problem List   Diagnosis Date Noted  . Labor and delivery, indication for care 05/20/2017  . Endometritis 05/03/2016  . Asthma 03/25/2014  . Tobacco smoking complicating pregnancy 03/25/2014     Plan  1. Admit to L&D for IV Pitocin induction 2. EFM:-- Category 1 3. Epidural if desired. Stadol for IV pain until epidural requested. 4. Admission labs  5. Anticipate NSVD  Doreene Burkennie Emiline Mancebo, CNM 05/20/2017 8:00 AM

## 2017-05-20 NOTE — Lactation Note (Signed)
This note was copied from a baby's chart. Lactation Consultation Note  Patient Name: [name] Today's Date: 05/20/2017     Maternal Data    Feeding    LATCH Score                   Interventions    Lactation Tools Discussed/Used     Consult Status  Pt states that she is breastfeeding well and does not require assistance at this time. LC left information and told pt to call out if help is needed or if there are questions/concerns about breastfeeding.     Burnadette PeterJaniya M Rhemi Balbach 05/20/2017, 3:21 PM

## 2017-05-21 LAB — CBC
HCT: 34 % — ABNORMAL LOW (ref 35.0–47.0)
HEMOGLOBIN: 11.9 g/dL — AB (ref 12.0–16.0)
MCH: 33.7 pg (ref 26.0–34.0)
MCHC: 35.1 g/dL (ref 32.0–36.0)
MCV: 96 fL (ref 80.0–100.0)
PLATELETS: 220 10*3/uL (ref 150–440)
RBC: 3.54 MIL/uL — AB (ref 3.80–5.20)
RDW: 14.9 % — ABNORMAL HIGH (ref 11.5–14.5)
WBC: 10.7 10*3/uL (ref 3.6–11.0)

## 2017-05-21 LAB — RPR: RPR Ser Ql: NONREACTIVE

## 2017-05-21 NOTE — Progress Notes (Signed)
05/21/2017 2:07 PM  BP (!) 100/50 (BP Location: Right Arm) Comment: nurse Harless LittenKendra Pierce notified  Pulse 71   Temp 98 F (36.7 C) (Oral)   Resp 18   Ht 5\' 5"  (165.1 cm)   Wt 185 lb (83915 g)   LMP 08/08/2016   SpO2 100%   BMI 30.79 kg/m  Patient discharged per MD orders. Discharge/postpartum instructions reviewed with patient and patient verbalized understanding. Infant in carseat. Discharged via wheelchair escorted by auxilary.  Ron ParkerHerron, Ordean Fouts D, RN

## 2017-05-21 NOTE — Discharge Instructions (Signed)
Breastfeeding °Deciding to breastfeed is one of the best choices you can make for you and your baby. A change in hormones during pregnancy causes your breast tissue to grow and increases the number and size of your milk ducts. These hormones also allow proteins, sugars, and fats from your blood supply to make breast milk in your milk-producing glands. Hormones prevent breast milk from being released before your baby is born as well as prompt milk flow after birth. Once breastfeeding has begun, thoughts of your baby, as well as his or her sucking or crying, can stimulate the release of milk from your milk-producing glands. °Benefits of breastfeeding °For Your Baby °· Your first milk (colostrum) helps your baby's digestive system function better. °· There are antibodies in your milk that help your baby fight off infections. °· Your baby has a lower incidence of asthma, allergies, and sudden infant death syndrome. °· The nutrients in breast milk are better for your baby than infant formulas and are designed uniquely for your baby’s needs. °· Breast milk improves your baby's brain development. °· Your baby is less likely to develop other conditions, such as childhood obesity, asthma, or type 2 diabetes mellitus. ° °For You °· Breastfeeding helps to create a very special bond between you and your baby. °· Breastfeeding is convenient. Breast milk is always available at the correct temperature and costs nothing. °· Breastfeeding helps to burn calories and helps you lose the weight gained during pregnancy. °· Breastfeeding makes your uterus contract to its prepregnancy size faster and slows bleeding (lochia) after you give birth. °· Breastfeeding helps to lower your risk of developing type 2 diabetes mellitus, osteoporosis, and breast or ovarian cancer later in life. ° °Signs that your baby is hungry °Early Signs of Hunger °· Increased alertness or activity. °· Stretching. °· Movement of the head from side to  side. °· Movement of the head and opening of the mouth when the corner of the mouth or cheek is stroked (rooting). °· Increased sucking sounds, smacking lips, cooing, sighing, or squeaking. °· Hand-to-mouth movements. °· Increased sucking of fingers or hands. ° °Late Signs of Hunger °· Fussing. °· Intermittent crying. ° °Extreme Signs of Hunger °Signs of extreme hunger will require calming and consoling before your baby will be able to breastfeed successfully. Do not wait for the following signs of extreme hunger to occur before you initiate breastfeeding: °· Restlessness. °· A loud, strong cry. °· Screaming. ° °Breastfeeding basics °Breastfeeding Initiation °· Find a comfortable place to sit or lie down, with your neck and back well supported. °· Place a pillow or rolled up blanket under your baby to bring him or her to the level of your breast (if you are seated). Nursing pillows are specially designed to help support your arms and your baby while you breastfeed. °· Make sure that your baby's abdomen is facing your abdomen. °· Gently massage your breast. With your fingertips, massage from your chest wall toward your nipple in a circular motion. This encourages milk flow. You may need to continue this action during the feeding if your milk flows slowly. °· Support your breast with 4 fingers underneath and your thumb above your nipple. Make sure your fingers are well away from your nipple and your baby’s mouth. °· Stroke your baby's lips gently with your finger or nipple. °· When your baby's mouth is open wide enough, quickly bring your baby to your breast, placing your entire nipple and as much of the colored area   around your nipple (areola) as possible into your baby's mouth. ? More areola should be visible above your baby's upper lip than below the lower lip. ? Your baby's tongue should be between his or her lower gum and your breast.  Ensure that your baby's mouth is correctly positioned around your nipple  (latched). Your baby's lips should create a seal on your breast and be turned out (everted).  It is common for your baby to suck about 2-3 minutes in order to start the flow of breast milk.  Latching Teaching your baby how to latch on to your breast properly is very important. An improper latch can cause nipple pain and decreased milk supply for you and poor weight gain in your baby. Also, if your baby is not latched onto your nipple properly, he or she may swallow some air during feeding. This can make your baby fussy. Burping your baby when you switch breasts during the feeding can help to get rid of the air. However, teaching your baby to latch on properly is still the best way to prevent fussiness from swallowing air while breastfeeding. Signs that your baby has successfully latched on to your nipple:  Silent tugging or silent sucking, without causing you pain.  Swallowing heard between every 3-4 sucks.  Muscle movement above and in front of his or her ears while sucking.  Signs that your baby has not successfully latched on to nipple:  Sucking sounds or smacking sounds from your baby while breastfeeding.  Nipple pain.  If you think your baby has not latched on correctly, slip your finger into the corner of your babys mouth to break the suction and place it between your baby's gums. Attempt breastfeeding initiation again. Signs of Successful Breastfeeding Signs from your baby:  A gradual decrease in the number of sucks or complete cessation of sucking.  Falling asleep.  Relaxation of his or her body.  Retention of a small amount of milk in his or her mouth.  Letting go of your breast by himself or herself.  Signs from you:  Breasts that have increased in firmness, weight, and size 1-3 hours after feeding.  Breasts that are softer immediately after breastfeeding.  Increased milk volume, as well as a change in milk consistency and color by the fifth day of  breastfeeding.  Nipples that are not sore, cracked, or bleeding.  Signs That Your [employer] is Getting Enough Milk  Wetting at least 1-2 diapers during the first 24 hours after birth.  Wetting at least 5-6 diapers every 24 hours for the first week after birth. The urine should be clear or pale yellow by 5 days after birth.  Wetting 6-8 diapers every 24 hours as your baby continues to grow and develop.  At least 3 stools in a 24-hour period by age 925 days. The stool should be soft and yellow.  At least 3 stools in a 24-hour period by age 92 days. The stool should be seedy and yellow.  No loss of weight greater than 10% of birth weight during the first 19 days of age.  Average weight gain of 4-7 ounces (113-198 g) per week after age 67 days.  Consistent daily weight gain by age 677 days, without weight loss after the age of 2 weeks.  After a feeding, your baby may spit up a small amount. This is common. Breastfeeding frequency and duration Frequent feeding will help you make more milk and can prevent sore nipples and breast engorgement. Breastfeed when  you feel the need to reduce the fullness of your breasts or when your baby shows signs of hunger. This is called "breastfeeding on demand." Avoid introducing a pacifier to your baby while you are working to establish breastfeeding (the first 4-6 weeks after your baby is born). After this time you may choose to use a pacifier. Research has shown that pacifier use during the first year of a baby's life decreases the risk of sudden infant death syndrome (SIDS). °Allow your baby to feed on each breast as long as he or she wants. Breastfeed until your baby is finished feeding. When your baby unlatches or falls asleep while feeding from the first breast, offer the second breast. Because newborns are often sleepy in the first few weeks of life, you may need to awaken your baby to get him or her to feed. °Breastfeeding times will vary from baby to baby. However,  the following rules can serve as a guide to help you ensure that your baby is properly fed: °· Newborns (babies 4 weeks of age or younger) may breastfeed every 1-3 hours. °· Newborns should not go longer than 3 hours during the day or 5 hours during the night without breastfeeding. °· You should breastfeed your baby a minimum of 8 times in a 24-hour period until you begin to introduce solid foods to your baby at around 6 months of age. ° °Breast milk pumping °Pumping and storing breast milk allows you to ensure that your baby is exclusively fed your breast milk, even at times when you are unable to breastfeed. This is especially important if you are going back to work while you are still breastfeeding or when you are not able to be present during feedings. Your lactation consultant can give you guidelines on how long it is safe to store breast milk. °A breast pump is a machine that allows you to pump milk from your breast into a sterile bottle. The pumped breast milk can then be stored in a refrigerator or freezer. Some breast pumps are operated by hand, while others use electricity. Ask your lactation consultant which type will work best for you. Breast pumps can be purchased, but some hospitals and breastfeeding support groups lease breast pumps on a monthly basis. A lactation consultant can teach you how to hand express breast milk, if you prefer not to use a pump. °Caring for your breasts while you breastfeed °Nipples can become dry, cracked, and sore while breastfeeding. The following recommendations can help keep your breasts moisturized and healthy: °· Avoid using soap on your nipples. °· Wear a supportive bra. Although not required, special nursing bras and tank tops are designed to allow access to your breasts for breastfeeding without taking off your entire bra or top. Avoid wearing underwire-style bras or extremely tight bras. °· Air dry your nipples for 3-4 minutes after each feeding. °· Use only cotton  bra pads to absorb leaked breast milk. Leaking of breast milk between feedings is normal. °· Use lanolin on your nipples after breastfeeding. Lanolin helps to maintain your skin's normal moisture barrier. If you use pure lanolin, you do not need to wash it off before feeding your baby again. Pure lanolin is not toxic to your baby. You may also hand express a few drops of breast milk and gently massage that milk into your nipples and allow the milk to air dry. ° °In the first few weeks after giving birth, some women experience extremely full breasts (engorgement). Engorgement can make your   breasts feel heavy, warm, and tender to the touch. Engorgement peaks within 3-5 days after you give birth. The following recommendations can help ease engorgement:  Completely empty your breasts while breastfeeding or pumping. You may want to start by applying warm, moist heat (in the shower or with warm water-soaked hand towels) just before feeding or pumping. This increases circulation and helps the milk flow. If your baby does not completely empty your breasts while breastfeeding, pump any extra milk after he or she is finished.  Wear a snug bra (nursing or regular) or tank top for 1-2 days to signal your body to slightly decrease milk production.  Apply ice packs to your breasts, unless this is too uncomfortable for you.  Make sure that your baby is latched on and positioned properly while breastfeeding.  If engorgement persists after 48 hours of following these recommendations, contact your health care provider or a [employer]. Overall health care recommendations while breastfeeding  Eat healthy foods. Alternate between meals and snacks, eating 3 of each per day. Because what you eat affects your breast milk, some of the foods may make your baby more irritable than usual. Avoid eating these foods if you are sure that they are negatively affecting your baby.  Drink milk, fruit juice, and water to  satisfy your thirst (about 10 glasses a day).  Rest often, relax, and continue to take your prenatal vitamins to prevent fatigue, stress, and anemia.  Continue breast self-awareness checks.  Avoid chewing and smoking tobacco. Chemicals from cigarettes that pass into breast milk and exposure to secondhand smoke may harm your baby.  Avoid alcohol and drug use, including marijuana. Some medicines that may be harmful to your baby can pass through breast milk. It is important to ask your health care provider before taking any medicine, including all over-the-counter and prescription medicine as well as vitamin and herbal supplements. It is possible to become pregnant while breastfeeding. If birth control is desired, ask your health care provider about options that will be safe for your baby. Contact a health care provider if:  You feel like you want to stop breastfeeding or have become frustrated with breastfeeding.  You have painful breasts or nipples.  Your nipples are cracked or bleeding.  Your breasts are red, tender, or warm.  You have a swollen area on either breast.  You have a fever or chills.  You have nausea or vomiting.  You have drainage other than breast milk from your nipples.  Your breasts do not become full before feedings by the fifth day after you give birth.  You feel sad and depressed.  Your baby is too sleepy to eat well.  Your baby is having trouble sleeping.  Your baby is wetting less than 3 diapers in a 24-hour period.  Your baby has less than 3 stools in a 24-hour period.  Your baby's skin or the white part of his or her eyes becomes yellow.  Your baby is not gaining weight by 74 days of age. Get help right away if:  Your baby is overly tired (lethargic) and does not want to wake up and feed.  Your baby develops an unexplained fever. This information is not intended to replace advice given to you by your health care provider. Make sure you discuss  any questions you have with your health care provider. Document Released: 09/24/2005 Document Revised: 03/07/2016 Document Reviewed: 03/18/2013 Else[employer] Interactive Patient Education  2017 [employer]  Home Care Instructions for Mom ACTIVITY  Gradually return to your regular activities.  Let yourself rest. Nap while your baby sleeps.  Avoid lifting anything that is heavier than 10 lb (4.5 kg) until your health care provider says it is okay.  Avoid activities that take a lot of effort and energy (are strenuous) until approved by your health care provider. Walking at a slow-to-moderate pace is usually safe.  If you had a cesarean delivery: ? Do not vacuum, climb stairs, or drive a car for 4-6 weeks. ? Have someone help you at home until you feel like you can do your usual activities yourself. ? Do exercises as told by your health care provider, if this applies.  VAGINAL BLEEDING You may continue to bleed for 4-6 weeks after delivery. Over time, the amount of blood usually decreases and the color of the blood usually gets lighter. However, the flow of bright red blood may increase if you have been too active. If you need to use more than one pad in an hour because your pad gets soaked, or if you pass a large clot:  Lie down.  Raise your feet.  Place a cold compress on your lower abdomen.  Rest.  Call your health care provider.  If you are breastfeeding, your period should return anytime between 8 weeks after delivery and the time that you stop breastfeeding. If you are not breastfeeding, your period should return 6-8 weeks after delivery. PERINEAL CARE The perineal area, or perineum, is the part of your body between your thighs. After delivery, this area needs special care. Follow these instructions as told by your health care provider.  Take warm tub baths for 15-20 minutes.  Use medicated pads and pain-relieving sprays and creams as told.  Do not use tampons or douches  until vaginal bleeding has stopped.  Each time you go to the bathroom: ? Use a peri bottle. ? Change your pad. ? Use towelettes in place of toilet paper until your stitches have healed.  Do Kegel exercises every day. Kegel exercises help to maintain the muscles that support the vagina, bladder, and bowels. You can do these exercises while you are standing, sitting, or lying down. To do Kegel exercises: ? Tighten the muscles of your abdomen and the muscles that surround your birth canal. ? Hold for a few seconds. ? Relax. ? Repeat until you have done this 5 times in a row.  To prevent hemorrhoids from developing or getting worse: ? Drink enough fluid to keep your urine clear or pale yellow. ? Avoid straining when having a bowel movement. ? Take over-the-counter medicines and stool softeners as told by your health care provider.  BREAST CARE  Wear a tight-fitting bra.  Avoid taking over-the-counter pain medicine for breast discomfort.  Apply ice to the breasts to help with discomfort as needed: ? Put ice in a plastic bag. ? Place a towel between your skin and the bag. ? Leave the ice on for 20 minutes or as told by your health care provider.  NUTRITION  Eat a well-balanced diet.  Do not try to lose weight quickly by cutting back on calories.  Take your prenatal vitamins until your postpartum checkup or until your health care provider tells you to stop.  POSTPARTUM DEPRESSION You may find yourself crying for no apparent reason and unable to cope with all of the changes that come with having a newborn. This mood is called postpartum depression. Postpartum depression happens because your hormone levels change after delivery. If you have  postpartum depression, get support from your partner, friends, and family. If the depression does not go away on its own after several weeks, contact your health care provider. BREAST SELF-EXAM Do a breast self-exam each month, at the same time of  the month. If you are breastfeeding, check your breasts just after a feeding, when your breasts are less full. If you are breastfeeding and your period has started, check your breasts on day 5, 6, or 7 of your period. Report any lumps, bumps, or discharge to your health care provider. Know that breasts are normally lumpy if you are breastfeeding. This is temporary, and it is not a health risk. INTIMACY AND SEXUALITY Avoid sexual activity for at least 3-4 weeks after delivery or until the brownish-red vaginal flow is completely gone. If you want to avoid pregnancy, use some form of birth control. You can get pregnant after delivery, even if you have not had your period. SEEK MEDICAL CARE IF:  You feel unable to cope with the changes that a child brings to your life, and these feelings do not go away after several weeks.  You notice a lump, a bump, or discharge on your breast.  SEEK IMMEDIATE MEDICAL CARE IF:  Blood soaks your pad in 1 hour or less.  You have: ? Severe pain or cramping in your lower abdomen. ? A bad-smelling vaginal discharge. ? A fever that is not controlled by medicine. ? A fever, and an area of your breast is red and sore. ? Pain or redness in your calf. ? Sudden, severe chest pain. ? Shortness of breath. ? Painful or bloody urination. ? Problems with your vision.  You vomit for 12 hours or longer.  You develop a severe headache.  You have serious thoughts about hurting yourself, your child, or anyone else.  This information is not intended to replace advice given to you by your health care provider. Make sure you discuss any questions you have with your health care provider. Document Released: 09/21/2000 Document Revised: 03/01/2016 Document Reviewed: 03/28/2015 Elsevier Interactive Patient Education  2017 [employer]  Intrauterine Device Insertion An intrauterine device (IUD) is a medical device that gets inserted into the uterus to prevent pregnancy. It is  a small, T-shaped device that has one or two nylon strings hanging down from it. The strings hang out of the lower part of the uterus (cervix) to allow for future IUD removal. There are two types of IUDs available: Copper IUD. This type of IUD has copper wire wrapped around it. Copper makes the uterus and fallopian tubes produce a fluid that kills sperm. A copper IUD may last up to 10 years. Hormone IUD. This type of IUD is made of plastic and contains the hormone progestin (synthetic progesterone). The hormone thickens mucus in the cervix and prevents sperm from entering the uterus. It also thins the uterine lining to prevent implantation of a fertilized egg. The hormone can weaken or kill the sperm that get into the uterus. A hormone IUD may last 3-5 years.  Tell a health care provider about: Any allergies you have. All medicines you are taking, including vitamins, herbs, eye drops, creams, and over-the-counter medicines. Any problems you or family members have had with anesthetic medicines. Any blood disorders you have. Any surgeries you have had. Any medical conditions you have, including any STIs (sexually transmitted infections) you may have. Whether you are pregnant or may be pregnant. What are the risks? Generally, this is a safe procedure. However, problems  may occur, including: Infection. Bleeding. Allergic reactions to medicines. Accidental puncture (perforation) of the uterus, or damage to other structures or organs. Accidental placement of the IUD either in the muscle layer of the uterus (myometrium) or outside the uterus. The IUD falling out of the uterus (expulsion). This is more common among women who have recently had a child. Pregnancy that happens in the fallopian tube (ectopic pregnancy). Infection of the uterus and fallopian tubes (pelvic inflammatory disease).  What happens before the procedure? Schedule the IUD insertion for when you will have your menstrual period or  right after, to make sure you are not pregnant. Placement of the IUD is better tolerated shortly after a menstrual cycle. Follow instructions from your health care provider about eating or drinking restrictions. Ask your health care provider about changing or stopping your regular medicines. This is especially important if you are taking diabetes medicines or blood thinners. You may get a pain reliever to take before the procedure. You may have tests for: Pregnancy. A pregnancy test involves having a urine sample taken. STIs. Placing an IUD in someone who has an STI can make the infection worse. Cervical cancer. You may have a Pap test to check for this type of cancer. This means collecting cells from your cervix to be examined under a microscope. You may have a physical exam to determine the size and position of your uterus. The procedure may vary among health care providers and hospitals. What happens during the procedure? A tool (speculum) will be placed in your vagina and widened so that your health care provider can see your cervix. Medicine may be applied to your cervix to help lower your risk of infection (antiseptic medicine). You may be given an anesthetic medicine to numb each side of your cervix (intracervical block or paracervical block). This medicine is usually given by an injection into the cervix. A tool (uterine sound) will be inserted into your uterus to determine the length of your uterus and the direction that your uterus may be tilted. A slim instrument or tube (IUD inserter) that holds the IUD will be inserted into your vagina, through your cervical canal, and into your uterus. The IUD will be placed in the uterus, and the IUD inserter will be removed. The strings that are attached to the IUD will be trimmed so that they lie just below the cervix. The procedure may vary among health care providers and hospitals. What happens after the procedure? You may have bleeding after  the procedure. This is normal. It varies from light bleeding (spotting) for a few days to menstrual-like bleeding. You may have cramping and pain. You may feel dizzy or light-headed. You may have lower back pain. Summary An intrauterine device (IUD) is a small, T-shaped device that has one or two nylon strings hanging down from it. Two types of IUDs are available. You may have a copper IUD or a hormone IUD. Schedule the IUD insertion for when you will have your menstrual period or right after, to make sure you are not pregnant. Placement of the IUD is better tolerated shortly after a menstrual cycle. You may have bleeding after the procedure. This is normal. It varies from light spotting for a few days to menstrual-like bleeding. This information is not intended to replace advice given to you by your health care provider. Make sure you discuss any questions you have with your health care provider. Document Released: 05/23/2011 Document Revised: 08/15/2016 Document Reviewed: 08/15/2016 Elsevier Interactive Patient Education  2017 Elsevier Inc. Levonorgestrel intrauterine device (IUD) What is this medicine? LEVONORGESTREL IUD (LEE voe nor jes trel) is a contraceptive (birth control) device. The device is placed inside the uterus by a healthcare professional. It is used to prevent pregnancy. This device can also be used to treat heavy bleeding that occurs during your period. This medicine may be used for other purposes; ask your health care provider or pharmacist if you have questions. COMMON BRAND NAME(S): Cameron Ali What should I tell my health care provider before I take this medicine? They need to know if you have any of these conditions: -abnormal Pap smear -cancer of the breast, uterus, or cervix -diabetes -endometritis -genital or pelvic infection now or in the past -have more than one sexual partner or your partner has more than one partner -heart disease -history  of an ectopic or tubal pregnancy -immune system problems -IUD in place -liver disease or tumor -problems with blood clots or take blood-thinners -seizures -use intravenous drugs -uterus of unusual shape -vaginal bleeding that has not been explained -an unusual or allergic reaction to levonorgestrel, other hormones, silicone, or polyethylene, medicines, foods, dyes, or preservatives -pregnant or trying to get pregnant -breast-feeding How should I use this medicine? This device is placed inside the uterus by a health care professional. Talk to your pediatrician regarding the use of this medicine in children. Special care may be needed. Overdosage: If you think you have taken too much of this medicine contact a poison control center or emergency room at once. NOTE: This medicine is only for you. Do not share this medicine with others. What if I miss a dose? This does not apply. Depending on the brand of device you have inserted, the device will need to be replaced every 3 to 5 years if you wish to continue using this type of birth control. What may interact with this medicine? Do not take this medicine with any of the following medications: -amprenavir -bosentan -fosamprenavir This medicine may also interact with the following medications: -aprepitant -armodafinil -barbiturate medicines for inducing sleep or treating seizures -bexarotene -boceprevir -griseofulvin -medicines to treat seizures like carbamazepine, ethotoin, felbamate, oxcarbazepine, phenytoin, topiramate -modafinil -pioglitazone -rifabutin -rifampin -rifapentine -some medicines to treat HIV infection like atazanavir, efavirenz, indinavir, lopinavir, nelfinavir, tipranavir, ritonavir -St. John's wort -warfarin This list may not describe all possible interactions. Give your health care provider a list of all the medicines, herbs, non-prescription drugs, or dietary supplements you use. Also tell them if you smoke,  drink alcohol, or use illegal drugs. Some items may interact with your medicine. What should I watch for while using this medicine? Visit your doctor or health care professional for regular check ups. See your doctor if you or your partner has sexual contact with others, becomes HIV positive, or gets a sexual transmitted disease. This product does not protect you against HIV infection (AIDS) or other sexually transmitted diseases. You can check the placement of the IUD yourself by reaching up to the top of your vagina with clean fingers to feel the threads. Do not pull on the threads. It is a good habit to check placement after each menstrual period. Call your doctor right away if you feel more of the IUD than just the threads or if you cannot feel the threads at all. The IUD may come out by itself. You may become pregnant if the device comes out. If you notice that the IUD has come out use a backup birth control method like  condoms and call your health care provider. Using tampons will not change the position of the IUD and are okay to use during your period. This IUD can be safely scanned with magnetic resonance imaging (MRI) only under specific conditions. Before you have an MRI, tell your healthcare provider that you have an IUD in place, and which type of IUD you have in place. What side effects may I notice from receiving this medicine? Side effects that you should report to your doctor or health care professional as soon as possible: -allergic reactions like skin rash, itching or hives, swelling of the face, lips, or tongue -fever, flu-like symptoms -genital sores -high blood pressure -no menstrual period for 6 weeks during use -pain, swelling, warmth in the leg -pelvic pain or tenderness -severe or sudden headache -signs of pregnancy -stomach cramping -sudden shortness of breath -trouble with balance, talking, or walking -unusual vaginal bleeding, discharge -yellowing of the eyes or  skin Side effects that usually do not require medical attention (report to your doctor or health care professional if they continue or are bothersome): -acne -breast pain -change in sex drive or performance -changes in weight -cramping, dizziness, or faintness while the device is being inserted -headache -irregular menstrual bleeding within first 3 to 6 months of use -nausea This list may not describe all possible side effects. Call your doctor for medical advice about side effects. You may report side effects to FDA at 1-800-FDA-1088. Where should I keep my medicine? This does not apply. NOTE: This sheet is a summary. It may not cover all possible information. If you have questions about this medicine, talk to your doctor, pharmacist, or health care provider.  2018 Elsevier/Gold Standard (2016-07-06 14:14:56) Postpartum Depression and Baby Blues The postpartum period begins right after the birth of a baby. During this time, there is often a great amount of joy and excitement. It is also a time of many changes in the life of the parents. Regardless of how many times a mother gives birth, each child brings new challenges and dynamics to the family. It is not unusual to have feelings of excitement along with confusing shifts in moods, emotions, and thoughts. All mothers are at risk of developing postpartum depression or the "baby blues." These mood changes can occur right after giving birth, or they may occur many months after giving birth. The baby blues or postpartum depression can be mild or severe. Additionally, postpartum depression can go away rather quickly, or it can be a long-term condition. What are the causes? Raised hormone levels and the rapid drop in those levels are thought to be a main cause of postpartum depression and the baby blues. A number of hormones change during and after pregnancy. Estrogen and progesterone usually decrease right after the delivery of your baby. The levels  of thyroid hormone and various cortisol steroids also rapidly drop. Other factors that play a role in these mood changes include major life events and genetics. What increases the risk? If you have any of the following risks for the baby blues or postpartum depression, know what symptoms to watch out for during the postpartum period. Risk factors that may increase the likelihood of getting the baby blues or postpartum depression include:  Having a personal or family history of depression.  Having depression while being pregnant.  Having premenstrual mood issues or mood issues related to oral contraceptives.  Having a lot of life stress.  Having marital conflict.  Lacking a social support network.  Having  a baby with special needs.  Having health problems, such as diabetes.  What are the signs or symptoms? Symptoms of baby blues include:  Brief changes in mood, such as going from extreme happiness to sadness.  Decreased concentration.  Difficulty sleeping.  Crying spells, tearfulness.  Irritability.  Anxiety.  Symptoms of postpartum depression typically begin within the first month after giving birth. These symptoms include:  Difficulty sleeping or excessive sleepiness.  Marked weight loss.  Agitation.  Feelings of worthlessness.  Lack of interest in activity or food.  Postpartum psychosis is a very serious condition and can be dangerous. Fortunately, it is rare. Displaying any of the following symptoms is cause for immediate medical attention. Symptoms of postpartum psychosis include:  Hallucinations and delusions.  Bizarre or disorganized behavior.  Confusion or disorientation.  How is this diagnosed? A diagnosis is made by an evaluation of your symptoms. There are no medical or lab tests that lead to a diagnosis, but there are various questionnaires that a health care provider may use to identify those with the baby blues, postpartum depression, or psychosis.  Often, a screening tool called the [place] Postnatal Depression Scale is used to diagnose depression in the postpartum period. How is this treated? The baby blues usually goes away on its own in 1-2 weeks. Social support is often all that is needed. You will be encouraged to get adequate sleep and rest. Occasionally, you may be given medicines to help you sleep. Postpartum depression requires treatment because it can last several months or longer if it is not treated. Treatment may include individual or group therapy, medicine, or both to address any social, physiological, and psychological factors that may play a role in the depression. Regular exercise, a healthy diet, rest, and social support may also be strongly recommended. Postpartum psychosis is more serious and needs treatment right away. Hospitalization is often needed. Follow these instructions at home:  Get as much rest as you can. Nap when the baby sleeps.  Exercise regularly. Some women find yoga and walking to be beneficial.  Eat a balanced and nourishing diet.  Do little things that you enjoy. Have a cup of tea, take a bubble bath, read your favorite magazine, or listen to your favorite music.  Avoid alcohol.  Ask for help with household chores, cooking, grocery shopping, or running errands as needed. Do not try to do everything.  Talk to people close to you about how you are feeling. Get support from your partner, family members, friends, or other new moms.  Try to stay positive in how you think. Think about the things you are grateful for.  Do not spend a lot of time alone.  Only take over-the-counter or prescription medicine as directed by your health care provider.  Keep all your postpartum appointments.  Let your health care provider know if you have any concerns. Contact a health care provider if: You are having a reaction to or problems with your medicine. Get help right away if:  You have suicidal  feelings.  You think you may harm the baby or someone else. This information is not intended to replace advice given to you by your health care provider. Make sure you discuss any questions you have with your health care provider. Document Released: 06/28/2004 Document Revised: 03/01/2016 Document Reviewed: 07/06/2013 Elsevier Interactive Patient Education  2017 [employer]

## 2017-05-21 NOTE — Discharge Summary (Signed)
Obstetric Discharge Summary  Patient ID: Christine Bowers MRN: [id number] DOB/AGE: [birth year] 35 y.o.   Date of Admission: 05/20/2017  Harlow MaresMelody Shambley, CNM Priscille Loveless(M. DeFrancesco, MD)  Date of Discharge: 05/21/2017 Serafina RoyalsMichelle Gretna Bergin, CNM Judie Petit(M. DeFrancesco, MD)  Admitting Diagnosis: Induction of labor at 6513w5d  Secondary Diagnosis: Asthma  Mode of Delivery: normal spontaneous vaginal delivery     Discharge Diagnosis: No other diagnosis   Intrapartum Procedures: pitocin augmentation   Post partum procedures: none  Complications: none   Brief Hospital Course  Christine Bowers is a [id number] who had a SVD on 05/20/2017;  for further details of this birth, please refer to the delivey note.  Patient had an uncomplicated postpartum course.  By time of discharge on PPD#1, her pain was controlled on oral pain medications; she had appropriate lochia and was ambulating, voiding without difficulty and tolerating regular diet.  She was deemed stable for discharge to home.    Labs: CBC Latest Ref Rng & Units 05/21/2017 05/20/2017 03/01/2017  WBC 3.6 - 11.0 K/uL 10.7 13.2(H) -  Hemoglobin 12.0 - 16.0 g/dL 11.9(L) 12.8 10.5(L)  Hematocrit 35.0 - 47.0 % 34.0(L) 36.5 32.1(L)  Platelets 150 - 440 K/uL 220 246 -   O POS  Physical exam:   Temp:  [98 F (36.7 C)-98.5 F (36.9 C)] 98 F (36.7 C) (08/14 1109) Pulse Rate:  [71-88] 71 (08/14 1109) Resp:  [16-18] 18 (08/14 1109) BP: (88-106)/(36-54) 100/50 (08/14 1109) SpO2:  [97 %-100 %] 100 % (08/14 0820)  General: alert and no distress  Breast: deferred, no complaints  Lochia: appropriate  Abdomen: soft, NT  Uterine Fundus: firm  Extremities: No evidence of DVT seen on physical exam. No lower extremity edema.  Discharge Instructions: Per After Visit Summary.  Activity: Advance as tolerated. Pelvic rest for 6 weeks.  Also refer to After Visit Summary  Diet: Regular  Medications: Allergies as of 05/21/2017      Reactions   Aspirin Other (See  Comments)   Ibuprofen Other (See Comments)      Medication List    TAKE these medications   albuterol 108 (90 Base) MCG/ACT inhaler Commonly known as:  PROVENTIL HFA;VENTOLIN HFA Inhale 2 puffs into the lungs every 6 (six) hours as needed for wheezing or shortness of breath.   ferrous sulfate 325 (65 FE) MG tablet Take 325 mg by mouth daily with breakfast.   montelukast 10 MG tablet Commonly known as:  SINGULAIR Take 1 tablet (10 mg total) by mouth at bedtime.   Prenatal Vitamins 28-0.8 MG Tabs Take 1 tablet by mouth daily.      Outpatient follow up:  Follow-up Information    Gunnar BullaLawhorn, Berma Harts Michelle, CNM. Schedule an appointment as soon as possible for a visit in 6 week(s).   Specialties:  Certified Nurse Midwife, Obstetrics and Gynecology, Radiology Why:  Please call to schedule a six (6) week postpartum appointment.  Contact information: [address] [place] [address] [phone]          Postpartum contraception: IUD, Mirena  Discharged Condition: stable  Discharged to: home   Newborn Data:  Disposition:home with mother  Apgars: APGAR (1 MIN): 8   APGAR (5 MINS): 9   APGAR (10 MINS):    Baby Feeding: Breast   Gunnar BullaJenkins Michelle Colten Desroches, CNM

## 2017-05-24 ENCOUNTER — Encounter: Payer: 59 | Admitting: Certified Nurse Midwife

## 2017-07-04 ENCOUNTER — Ambulatory Visit (INDEPENDENT_AMBULATORY_CARE_PROVIDER_SITE_OTHER): Payer: Medicaid Other | Admitting: Certified Nurse Midwife

## 2017-07-04 ENCOUNTER — Encounter: Payer: Self-pay | Admitting: Certified Nurse Midwife

## 2017-07-04 NOTE — Progress Notes (Signed)
Subjective:    Christine Bowers is a 35 y.o. [id number] Caucasian female who presents for a postpartum visit.   She is 6 weeks postpartum following a spontaneous vaginal delivery at 39+4 gestational weeks. Anesthesia: none. I have fully reviewed the prenatal and intrapartum course.   Postpartum course has been uncomplicated. Baby's course has been uncomplicated. Baby is feeding by breast. Bleeding no bleeding. Bowel function is normal. Bladder function is normal.   Patient is sexually active. Last sexual activity: last week. Contraception method is condoms. Postpartum depression screening: negative. Score 0.  Last pap 06/2014 and was normal.  The following portions of the patient's history were reviewed and updated as appropriate: allergies, current medications, past medical history, past surgical history and problem list.  Review of Systems  Pertinent items are noted in HPI.   Objective:   BP 111/73   Pulse 93   Wt 171 lb 1.6 oz (77.6 kg)   Breastfeeding? Yes   BMI 28.47 kg/m   General:  alert, cooperative and no distress   Breasts:  deferred, no complaints  Lungs: clear to auscultation bilaterally  Heart:  regular rate and rhythm  Abdomen: soft, nontender   Vulva: normal  Vagina: normal vagina  Cervix:  closed  Corpus: Well-involuted  Adnexa:  Non-palpable     Depression screen [facility] 2/9 07/04/2017  Decreased Interest 0  Down, Depressed, Hopeless 0  PHQ - 2 Score 0  Altered sleeping 0  Tired, decreased energy 0  Change in appetite 0  Feeling bad or failure about yourself  0  Trouble concentrating 0  Moving slowly or fidgety/restless 0  Suicidal thoughts 0  PHQ-9 Score 0    Assessment:   Postpartum exam Six (6) wks s/p spontaneous vaginal birth Breastfeeding Depression screening Contraception counseling   Plan:   Desires natural family planning  Follow up in: 6-12 weeks for annual exam or earlier if needed   Gunnar Bulla, CNM

## 2017-07-04 NOTE — Patient Instructions (Signed)
Natural Family Planning Introduction Natural Family Planning (NFP) is a type of birth control without using any form of contraception. Women who use NFP should not have sexual intercourse when the ovary produces an egg (ovulation) during the menstrual cycle. The NFP method is safe and can prevent pregnancy. It is 75% effective when practiced right. The man needs to also understand this method of birth control and the woman needs to be aware of how her body functions during her menstrual cycle. NFP can also be used as a method of getting pregnant. HOW THE NFP METHOD WORKS  A woman's menstrual period usually happens every 28-30 days (it can vary from 23-35 days).  Ovulation happens 12-14 days before the start of the next menstrual period (the fertile period). The egg is fertile for 24 hours and the sperm can live for 3 days or more. If there is sexual intercourse at this time, pregnancy can occur. THERE ARE MANY TYPES OF NFP METHODS USED TO PREVENT PREGNANCY  The basal body temperature method. Often times, there is a slight increase of body temperature when a woman ovulates. Take your temperature every morning before getting out of bed. Write the temperature on a chart. An increase in the temperature shows ovulation has happened. Do not have sexual intercourse from the menstrual period up to three days after the increase in the temperature. Note that the body temperature may increase as a result of fever, restless sleep, and working schedules.  The ovulation cervical mucus method. During the menstrual cycle, the cervical mucus changes from dry and sticky to wet and slippery. Check the mucus of the vagina every day to look for these changes. Just before ovulation, the mucus becomes wet and slippery. On the last day of wetness, ovulation happens. To avoid getting pregnant, sexual intercourse is safe for about 10 days after the menstrual period and on the dry mucus days. Do not have sexual intercourse when  the mucus starts to show up and not until 4 days after the wet and slippery mucus goes away. Sexual intercourse after the 4 days have passed until the menstrual period starts is a safe time. Note that the mucus from the vagina can increase because of a vaginal or cervical infection, lubricants, some medicines, and sexual excitement.  The symptothermal method. This method uses both the temperature and the ovulation methods. Combine the two methods above to prevent pregnancy.  The calendar method. Record your menstrual periods and length of the cycles for 6 months. This is helpful when the menstrual cycle varies in the length of the cycle. The length of a menstrual cycle is from day 1 of the present menstrual period to day 1 of the next menstrual period. Then, find your fertile days of the month and do not have sexual intercourse during that time. You may need help from your health care provider to find out your fertile days. There are some signs of ovulation that may be helpful when trying to find the time of ovulation. This includes vaginal spotting or abdominal cramps during the middle of your menstrual cycle. Not all women have these symptoms. YOU SHOULD NOT USE NFP IF:  You have very irregular menstrual periods and may skip months.  You have abnormal bleeding.  You have a vaginal or cervical infection.  You are on medicines that can affect the vaginal mucus or body temperature. These medicines include antibiotics, thyroid medicines, and antihistamines (cold and allergy medicine). This information is not intended to replace advice given   to you by your health care provider. Make sure you discuss any questions you have with your health care provider. Document Released: 03/12/2008 Document Revised: 03/01/2016 Document Reviewed: 03/27/2013 Elsevier Interactive Patient Education  2017 Elsevier Inc.  

## 2017-07-24 ENCOUNTER — Other Ambulatory Visit: Payer: Self-pay | Admitting: Obstetrics and Gynecology

## 2017-09-02 ENCOUNTER — Ambulatory Visit (INDEPENDENT_AMBULATORY_CARE_PROVIDER_SITE_OTHER): Payer: Medicaid Other | Admitting: Certified Nurse Midwife

## 2017-09-02 ENCOUNTER — Encounter: Payer: Self-pay | Admitting: Certified Nurse Midwife

## 2017-09-02 VITALS — BP 97/63 | HR 97 | Ht 65.0 in | Wt 183.2 lb

## 2017-09-02 DIAGNOSIS — Z01419 Encounter for gynecological examination (general) (routine) without abnormal findings: Secondary | ICD-10-CM | POA: Diagnosis not present

## 2017-09-02 NOTE — Progress Notes (Signed)
Pt is here for her annual exam. Has not had a period. Is not using any birth control.

## 2017-09-02 NOTE — Progress Notes (Signed)
ANNUAL PREVENTATIVE CARE GYN  ENCOUNTER NOTE  Subjective:       Christine Bowers is a 36 y.o. [phone] female here for a routine annual gynecologic exam.  No current complaints.   Denies difficulty breathing or respiratory distress, chest pain, abdominal pain, vaginal bleeding, change in vaginal discharge, dysuria, and leg pain or swelling.    Gynecologic History No LMP recorded. Stopped breastfeeding last month.  Contraception: condoms  Last Pap: 10/2016. Results were: normal  Obstetric History  OB History  Gravida Para Term Preterm AB Living  6 4 4   2 4   SAB TAB Ectopic Multiple Live Births  1   1   4     # Outcome Date GA Lbr Len/2nd Weight Sex Delivery Anes PTL Lv  6 Term 05/20/17 3658w4d   F Vag-Spont None N LIV  5 Ectopic 04/2016          4 Term 03/26/14 2824w0d  8 lb 9 oz (3.884 kg) M Vag-Spont   LIV  3 Term 04/29/06 7024w0d  8 lb 7 oz (3.827 kg) M Vag-Spont   LIV  2 Term 11/08/03 4224w0d  8 lb 5 oz (3.771 kg) F Vag-Spont   LIV  1 SAB  1715w0d           Obstetric Comments  [place] [place] [employer]  [place]  [facility]    Past Medical History:  Diagnosis Date  . Asthma   . Miscarriage     Past Surgical History:  Procedure Laterality Date  . DILATION AND CURETTAGE OF UTERUS    . DILATION AND EVACUATION N/A 05/04/2016   Procedure: DILATATION AND EVACUATION;  Surgeon: Christeen DouglasBethany Beasley, MD;  Location: [facility] ORS;  Service: Gynecology;  Laterality: N/A;    Current Outpatient Medications on File Prior to Visit  Medication Sig Dispense Refill  . montelukast (SINGULAIR) 10 MG tablet Take 1 tablet (10 mg total) by mouth at bedtime. 30 tablet 6  . VENTOLIN HFA 108 (90 Base) MCG/ACT inhaler INHALE 2 PUFFS BY MOUTH EVERY 6 HOURS AS NEEDED FOR WHEEZING OR SHORTNESS OF BREATH 18 each 2   No current facility-administered medications on file prior to visit.     Allergies  Allergen Reactions  . Aspirin Other (See Comments)  . Ibuprofen Other (See Comments)     Social History   Socioeconomic History  . Marital status: In a relationship    Spouse name: Not on file  . Number of children: Not on file  . Years of education: Not on file  . Highest education level: Not on file  Social Needs  . Financial resource strain: Not on file  . Food insecurity - worry: Not on file  . Food insecurity - inability: Not on file  . Transportation needs - medical: Not on file  . Transportation needs - non-medical: Not on file  Occupational History    Employer: [employer]  Tobacco Use  . Smoking status: Former Smoker    Packs/day: 0.50    Years: 20.00    Pack years: 10.00    Types: Cigarettes  . Smokeless tobacco: Never Used  Substance and Sexual Activity  . Alcohol use: No    Alcohol/week: 0.6 oz    Types: 1 Standard drinks or equivalent per week  . Drug use: No  . Sexual activity: Yes    Partners: Female    Birth control/protection: None  Other Topics Concern  . Not on file  Social History  Narrative  . Not on file    Family History  Problem Relation Age of Onset  . Thyroid disease Mother   . Hypertension Father     The following portions of the patient's history were reviewed and updated as appropriate: allergies, current medications, past family history, past medical history, past social history, past surgical history and problem list.  Review of Systems  ROS negative except as noted above. Information obtained from patient.   Objective:   BP 97/63   Pulse 97   Ht 5\' 5"  (1.651 m)   Wt 183 lb 3 oz (83.1 kg)   Breastfeeding? No   BMI 30.48 kg/m   CONSTITUTIONAL: Well-developed, well-nourished female in no acute distress.   PSYCHIATRIC: Normal mood and affect. Normal behavior. Normal judgment and thought content.  NEUROLGIC: Alert and oriented to person, place, and time. Normal muscle tone coordination. No cranial nerve deficit noted.  HENT:  Normocephalic, atraumatic, External right and left ear normal. Oropharynx is clear  and moist  EYES: Conjunctivae and EOM are normal. Pupils are equal, round, and reactive to light. No scleral icterus.   NECK: Normal range of motion, supple, no masses.  Normal thyroid.   SKIN: Skin is warm and dry. No rash noted. Not diaphoretic. No erythema. No pallor.  CARDIOVASCULAR: Normal heart rate noted, regular rhythm,  no murmur.  RESPIRATORY: Clear to auscultation bilaterally. Effort and breath sounds normal, no problems with respiration noted.  BREASTS: Symmetric in size. No masses, skin changes, nipple drainage, or lymphadenopathy.  ABDOMEN: Soft, normal bowel sounds, no distention noted.  No tenderness, rebound or guarding.   PELVIC:  External Genitalia: Normal  Vagina: Normal  Cervix: Normal  Uterus: Normal  Adnexa: Normal    MUSCULOSKELETAL: Normal range of motion. No tenderness.  No cyanosis, clubbing, or edema.  2+ distal pulses.  LYMPHATIC: No Axillary, Supraclavicular, or Inguinal Adenopathy.  Assessment:   Annual gynecologic examination 36 y.o.   Contraception: condoms   Obesity 1   Problem List Items Addressed This Visit    None    Visit Diagnoses    Women's annual routine gynecological examination    -  Primary      Plan:   Pap: Not needed.  Labs: Declined.  Routine preventative health maintenance measures emphasized: Exercise/Diet/Weight control, Tobacco Warnings and Alcohol/Substance use risks.  Reviewed red flag symptoms and when to call.   RTC x 1 year for annual exam or sooner if needed.    Gunnar BullaJenkins Christine Bowers, CNM [facility], [facility]

## 2017-09-02 NOTE — Patient Instructions (Signed)
Preventive Care 18-39 Years, Female Preventive care refers to lifestyle choices and visits with your health care provider that can promote health and wellness. What does preventive care include?  A yearly physical exam. This is also called an annual well check.  Dental exams once or twice a year.  Routine eye exams. Ask your health care provider how often you should have your eyes checked.  Personal lifestyle choices, including: ? Daily care of your teeth and gums. ? Regular physical activity. ? Eating a healthy diet. ? Avoiding tobacco and drug use. ? Limiting alcohol use. ? Practicing safe sex. ? Taking vitamin and mineral supplements as recommended by your health care provider. What happens during an annual well check? The services and screenings done by your health care provider during your annual well check will depend on your age, overall health, lifestyle risk factors, and family history of disease. Counseling Your health care provider may ask you questions about your:  Alcohol use.  Tobacco use.  Drug use.  Emotional well-being.  Home and relationship well-being.  Sexual activity.  Eating habits.  Work and work Statistician.  Method of birth control.  Menstrual cycle.  Pregnancy history.  Screening You may have the following tests or measurements:  Height, weight, and BMI.  Diabetes screening. This is done by checking your blood sugar (glucose) after you have not eaten for a while (fasting).  Blood pressure.  Lipid and cholesterol levels. These may be checked every 5 years starting at age 66.  Skin check.  Hepatitis C blood test.  Hepatitis B blood test.  Sexually transmitted disease (STD) testing.  BRCA-related cancer screening. This may be done if you have a family history of breast, ovarian, tubal, or peritoneal cancers.  Pelvic exam and Pap test. This may be done every 3 years starting at age 40. Starting at age 59, this may be done every 5  years if you have a Pap test in combination with an HPV test.  Discuss your test results, treatment options, and if necessary, the need for more tests with your health care provider. Vaccines Your health care provider may recommend certain vaccines, such as:  Influenza vaccine. This is recommended every year.  Tetanus, diphtheria, and acellular pertussis (Tdap, Td) vaccine. You may need a Td booster every 10 years.  Varicella vaccine. You may need this if you have not been vaccinated.  HPV vaccine. If you are 69 or younger, you may need three doses over 6 months.  Measles, mumps, and rubella (MMR) vaccine. You may need at least one dose of MMR. You may also need a second dose.  Pneumococcal 13-valent conjugate (PCV13) vaccine. You may need this if you have certain conditions and were not previously vaccinated.  Pneumococcal polysaccharide (PPSV23) vaccine. You may need one or two doses if you smoke cigarettes or if you have certain conditions.  Meningococcal vaccine. One dose is recommended if you are age 27-21 years and a first-year college student living in a residence hall, or if you have one of several medical conditions. You may also need additional booster doses.  Hepatitis A vaccine. You may need this if you have certain conditions or if you travel or work in places where you may be exposed to hepatitis A.  Hepatitis B vaccine. You may need this if you have certain conditions or if you travel or work in places where you may be exposed to hepatitis B.  Haemophilus influenzae type b (Hib) vaccine. You may need this if  you have certain risk factors.  Talk to your health care provider about which screenings and vaccines you need and how often you need them. This information is not intended to replace advice given to you by your health care provider. Make sure you discuss any questions you have with your health care provider. Document Released: 11/20/2001 Document Revised: 06/13/2016  Document Reviewed: 07/26/2015 Elsevier Interactive Patient Education  2017 [employer].

## 2017-12-21 IMAGING — US US OB COMP LESS 14 WK
1 series · 13 of 28 positions shown · non-contrast
Comparison: None for this pregnancy

CLINICAL DATA: 35-year-old female with positive HCG levels
presenting with vaginal bleeding.

EXAM:
OBSTETRIC <14 WK US AND TRANSVAGINAL OB US
TECHNIQUE: Both transabdominal and transvaginal ultrasound examinations were
performed for complete evaluation of the gestation as well as the
maternal uterus, adnexal regions, and pelvic cul-de-sac.
Transvaginal technique was performed to assess early pregnancy.

[Series 1: us ob comp less 14 wk · 0.19mm/px · 13 of 53 slices shown]
[im 2/53]
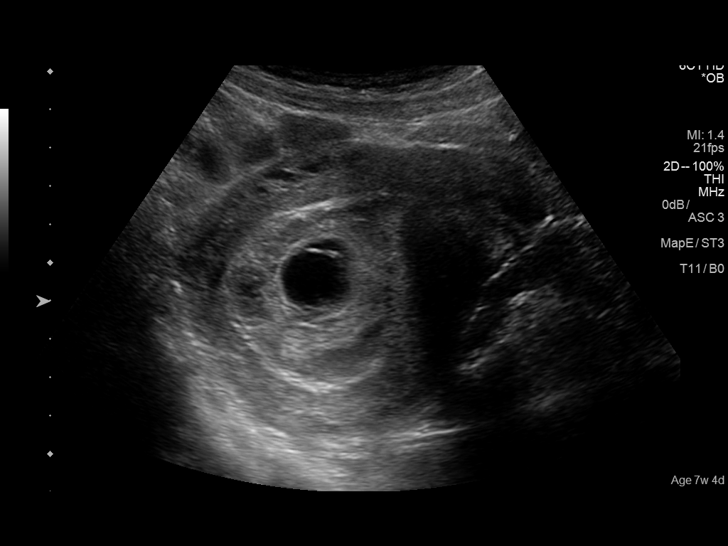
[im 6/53]
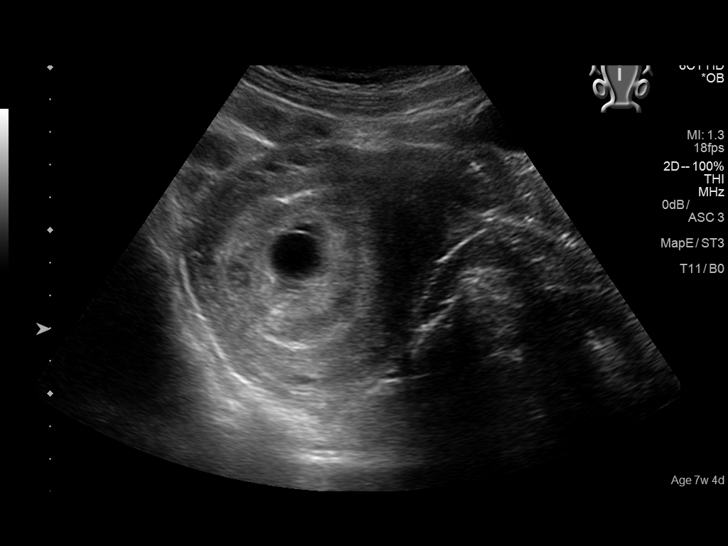
[im 10/53]
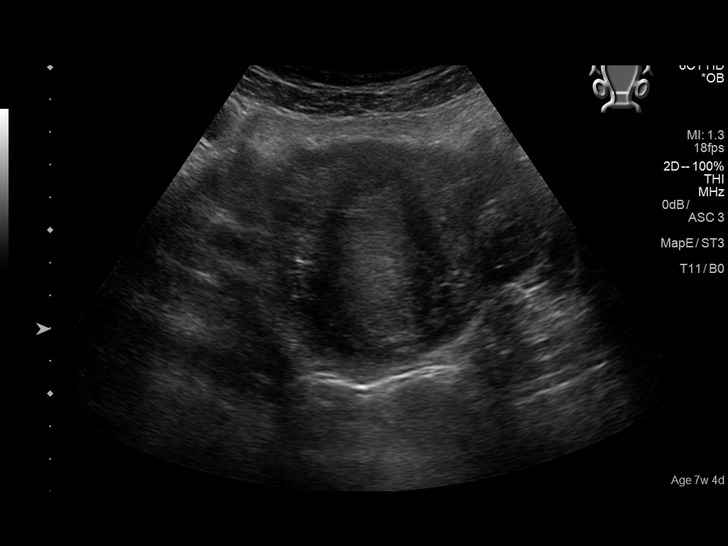
[im 14/53]
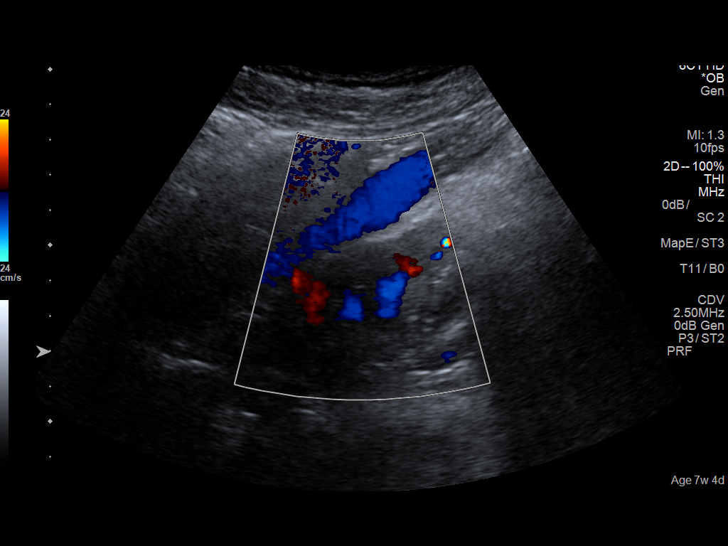
[im 18/53]
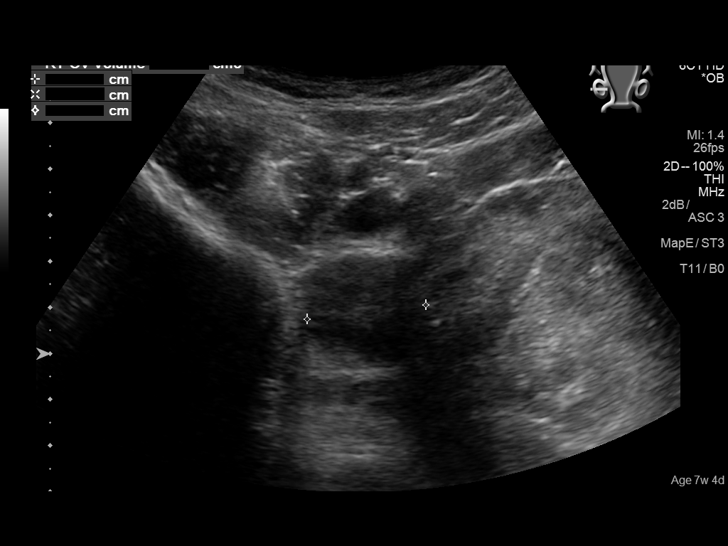
[im 22/53]
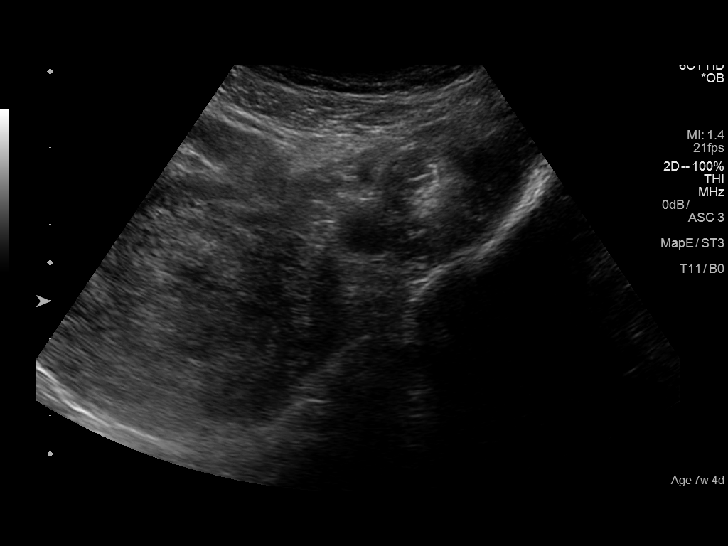
[im 27/53]
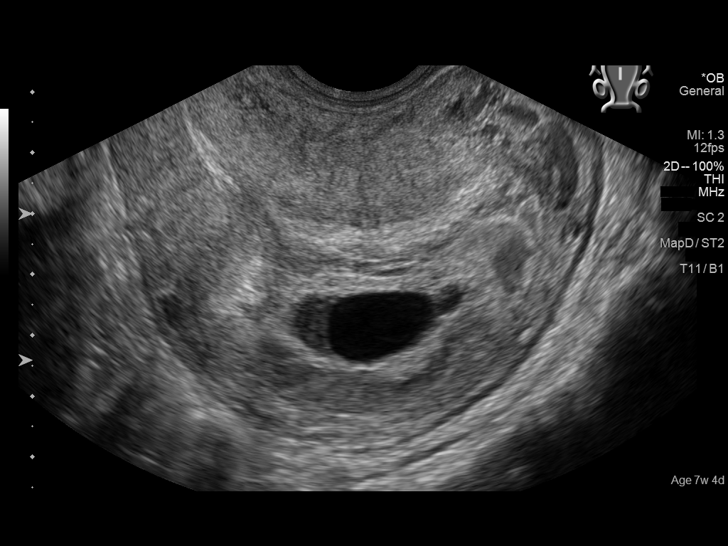
[im 31/53]
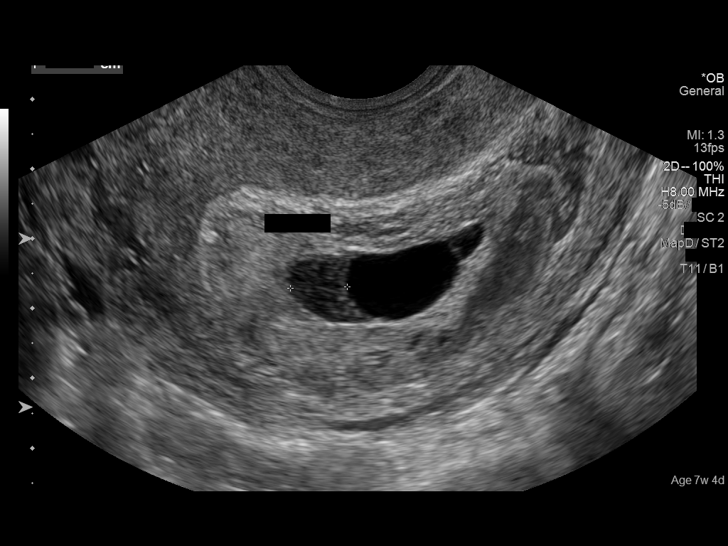
[im 35/53]
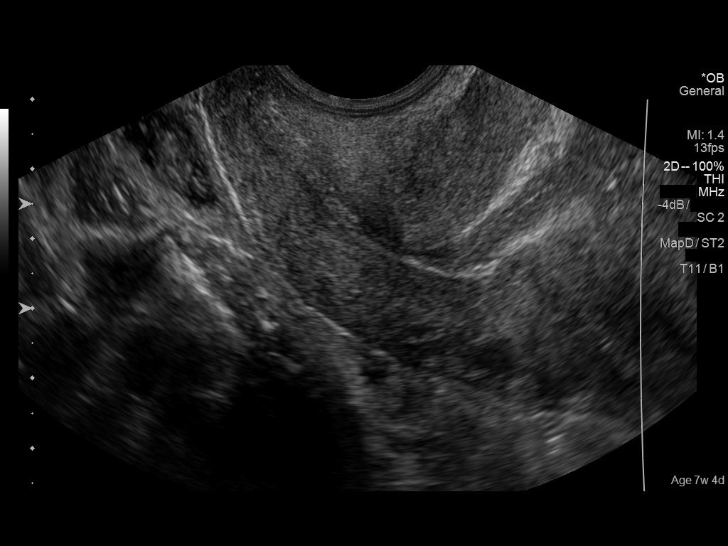
[im 39/53]
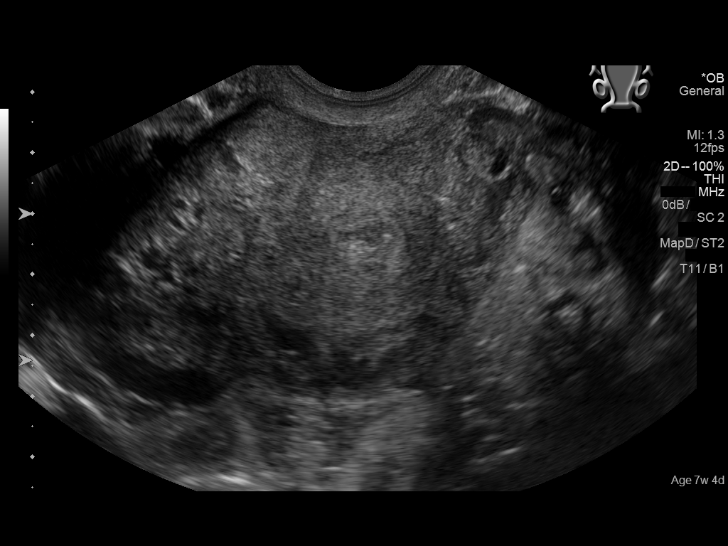
[im 43/53]
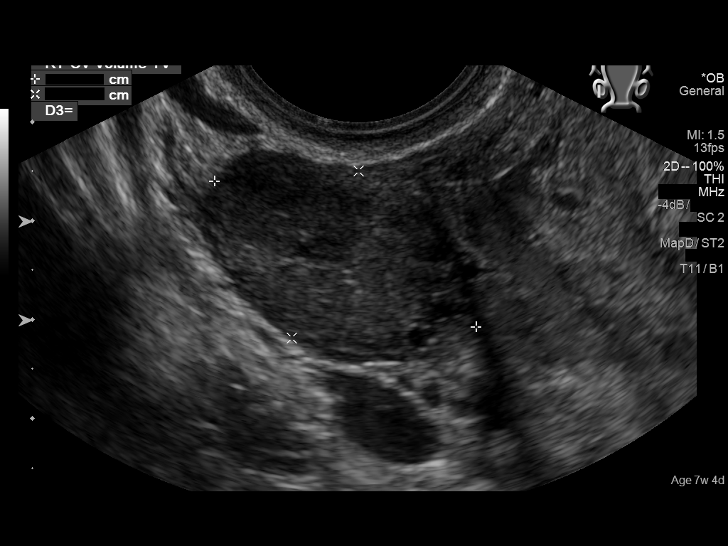
[im 47/53]
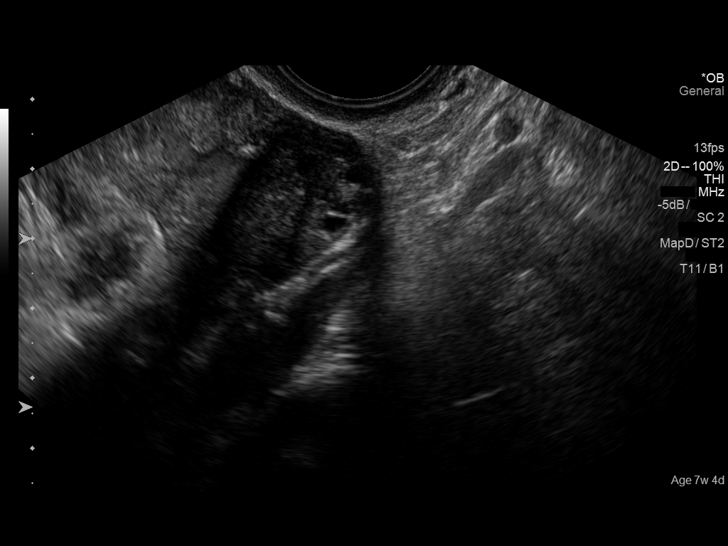
[im 51/53]
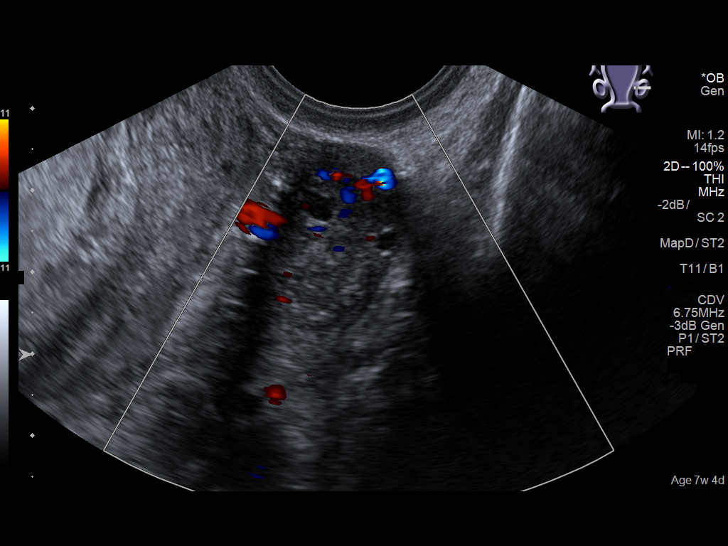

[13 of 28 positions shown; findings below may reference images not displayed]

FINDINGS: The uterus is heterogeneous. There is a cystic structure within the
endometrial canal. No yolk sac or fetal pole identified within this
cystic structure. This cystic structure may represent an early
gestational sac, or a blighted ovum. Pseudo gestation of an ectopic
pregnancy is not excluded. Correlation with clinical exam and
follow-up with serial HCG levels and ultrasound recommended.

If this cystic structure is a true gestational sac the estimated
gestational age based on mean sac diameter of 12 mm is 6 weeks, 0
days.

There is a small subchorionic hemorrhage measuring 1.1 x 0.7 cm.

The maternal ovaries appear unremarkable.

No significant free fluid within pelvis.
IMPRESSION: Intrauterine cystic structure as described may represent an early
gestational sac or a blighted ovum. Ectopic pregnancy is not
excluded. Correlation with clinical exam and follow-up with serial
HCG levels and ultrasound recommended.

Small subchorionic hemorrhage.  Follow-up recommended.

## 2017-12-23 ENCOUNTER — Encounter: Payer: Self-pay | Admitting: Certified Nurse Midwife

## 2017-12-23 ENCOUNTER — Ambulatory Visit (INDEPENDENT_AMBULATORY_CARE_PROVIDER_SITE_OTHER): Payer: Medicaid Other | Admitting: Certified Nurse Midwife

## 2017-12-23 VITALS — BP 95/62 | HR 83 | Ht 65.0 in | Wt 192.9 lb

## 2017-12-23 DIAGNOSIS — N926 Irregular menstruation, unspecified: Secondary | ICD-10-CM

## 2017-12-23 LAB — POCT URINE PREGNANCY: Preg Test, Ur: NEGATIVE

## 2017-12-23 IMAGING — US US OB TRANSVAGINAL
1 series · 14 of 28 positions shown · non-contrast
Comparison: Ultrasound dated 05/02/2016

CLINICAL DATA: Early pregnancy.  Vaginal bleeding.

EXAM:
OBSTETRIC <14 WK US AND TRANSVAGINAL OB US
TECHNIQUE: Both transabdominal and transvaginal ultrasound examinations were
performed for complete evaluation of the gestation as well as the
maternal uterus, adnexal regions, and pelvic cul-de-sac.
Transvaginal technique was performed to assess early pregnancy.

[Series 1: us ob transvaginal · 0.12mm/px · 14 of 116 slices shown]
[im 5/116]
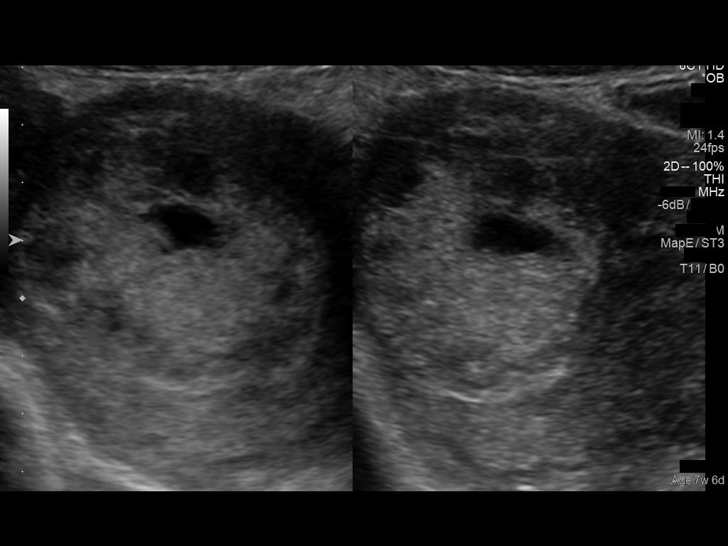
[im 13/116]
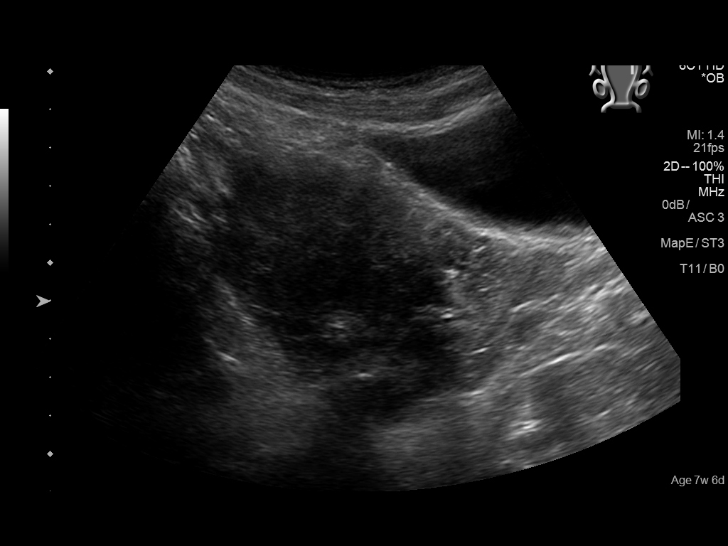
[im 22/116]
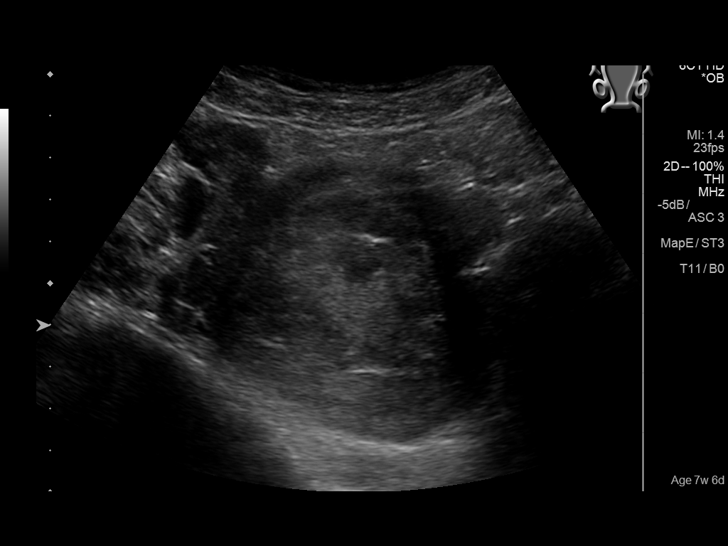
[im 30/116]
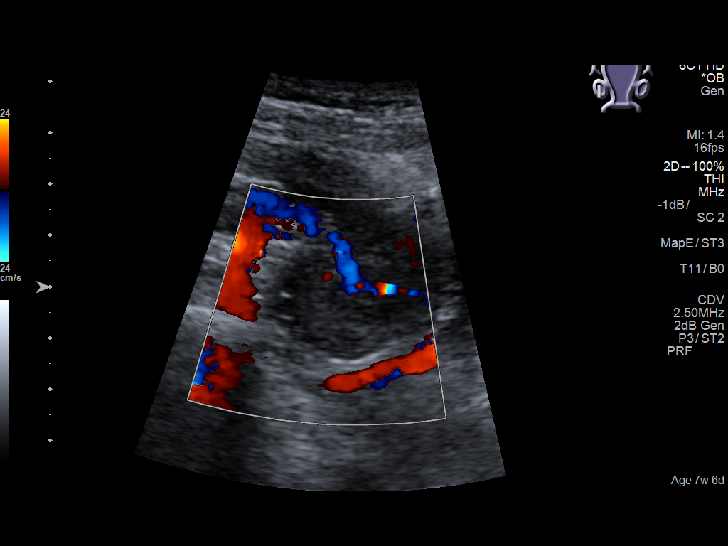
[im 39/116]
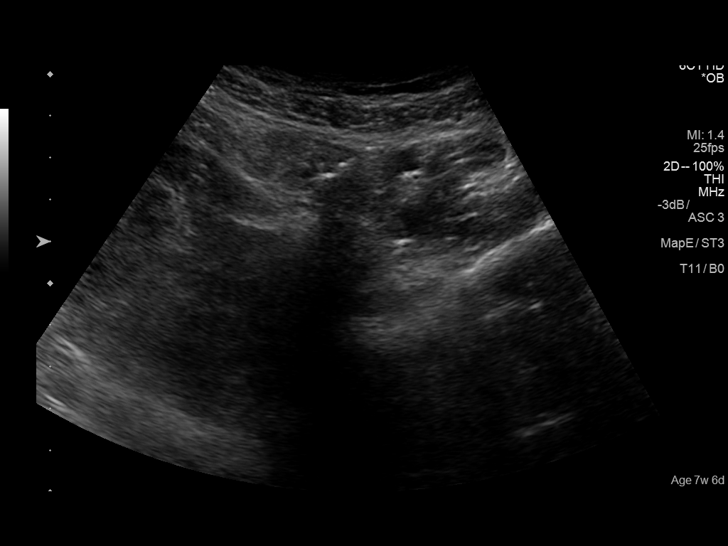
[im 47/116]
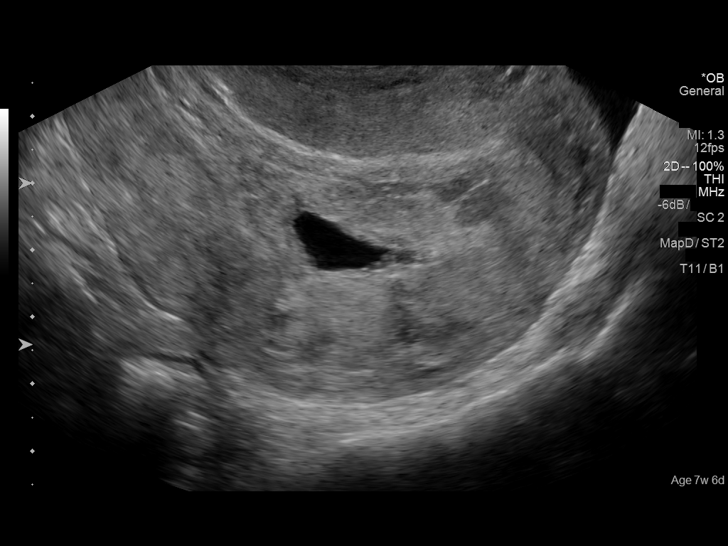
[im 56/116]
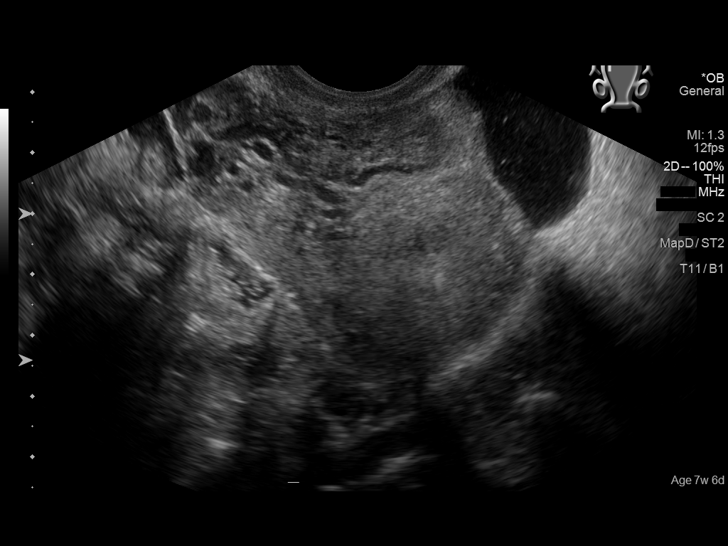
[im 64/116]
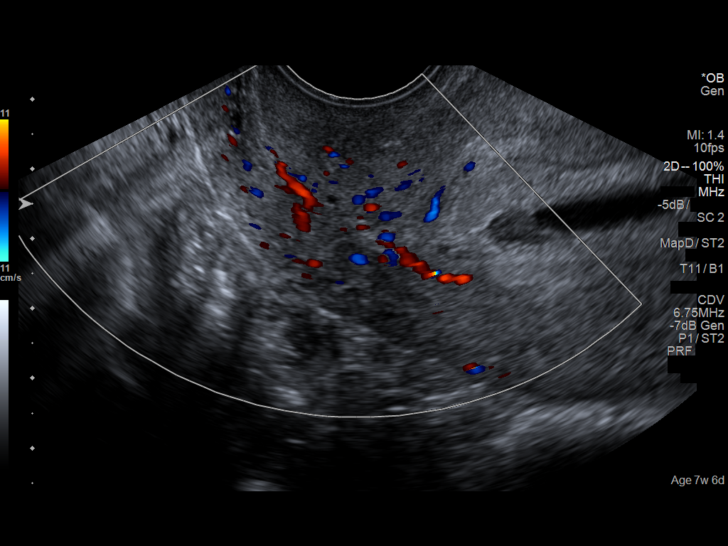
[im 73/116]
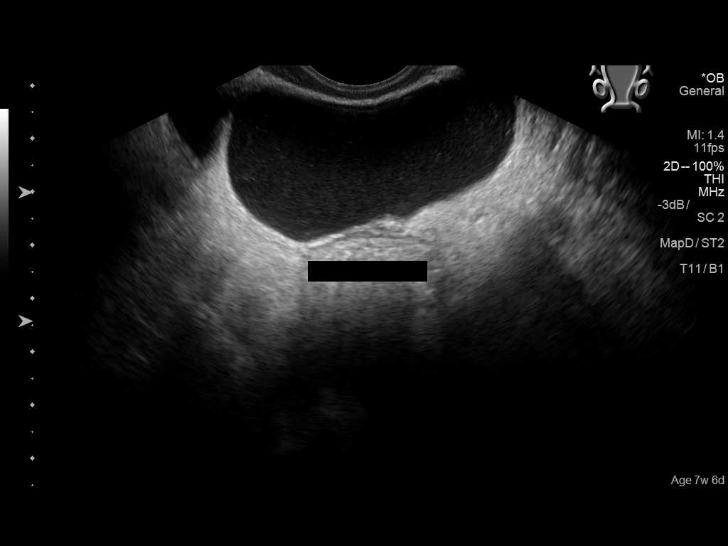
[im 81/116]
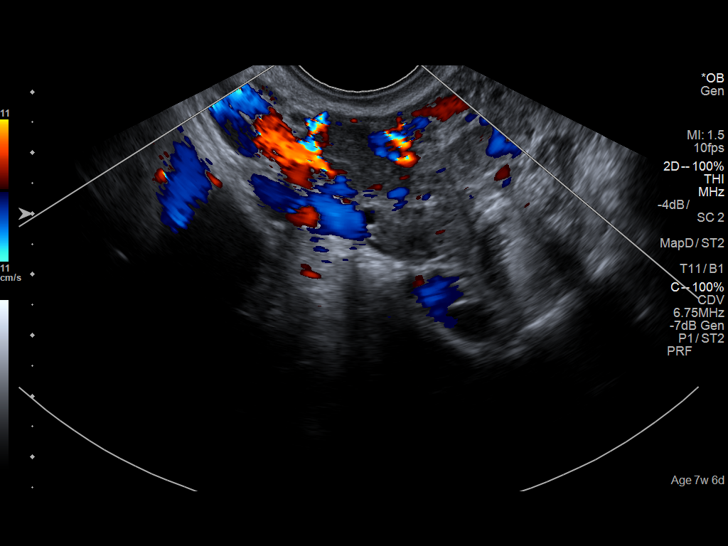
[im 90/116]
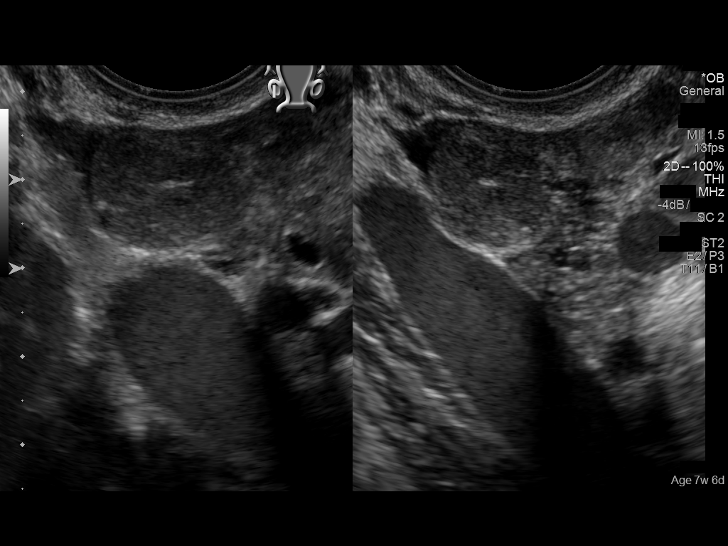
[im 98/116]
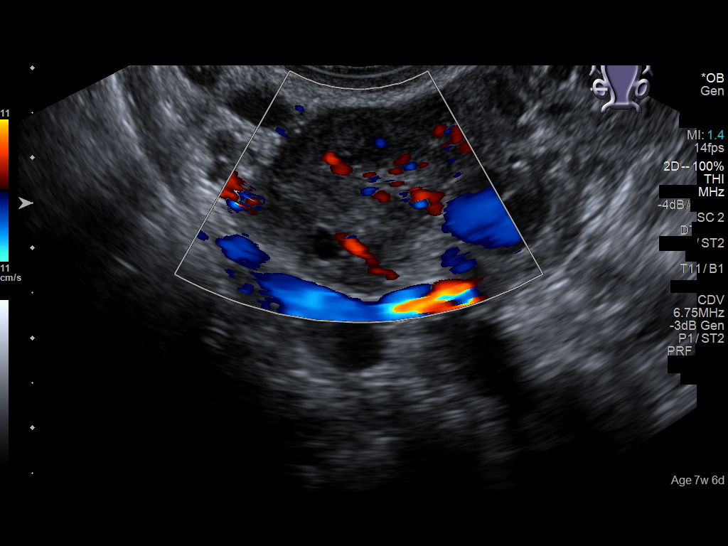
[im 107/116]
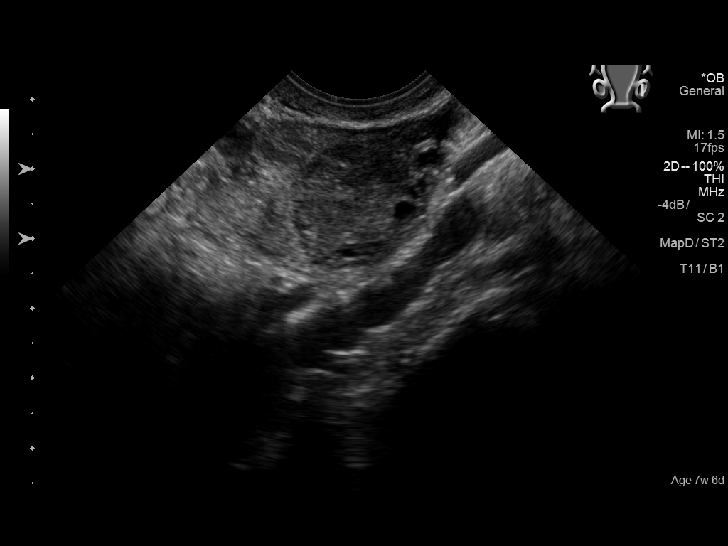
[im 116/116]
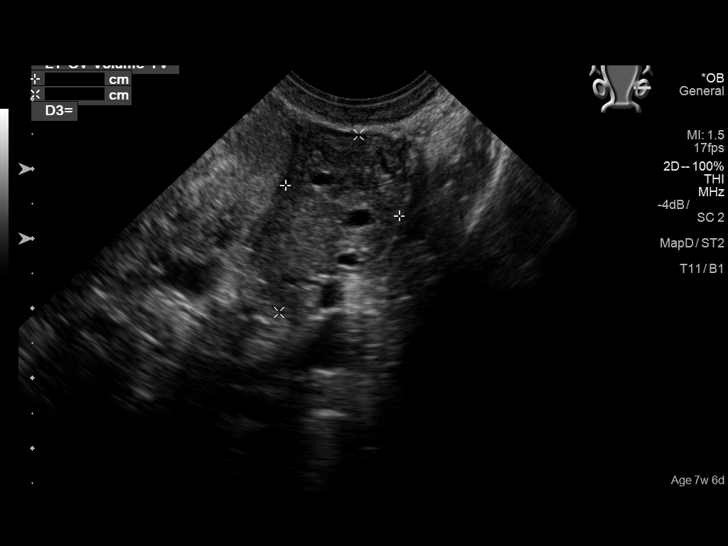

[14 of 28 positions shown; findings below may reference images not displayed]

FINDINGS: Intrauterine gestational sac: Single, slightly irregular in contour

Yolk sac:  No

Embryo:  No

Cardiac Activity: No

MSD: 12  mm   6 w   0  d

US EDC: 12/28/2016

Subchorionic hemorrhage:  Small, unchanged

Maternal uterus/adnexae: 1.8 cm slightly hypoechoic area in the
right ovary, consistent with a corpus luteum. The endometrial
contents appeared in motion during the exam, worrisome for a
spontaneous abortion in progress.
IMPRESSION: Intrauterine gestational sac without a fetal pole. Unchanged small
subchorionic hemorrhage. Motion of the endometrial contents suggests
impending spontaneous abortion.

## 2017-12-23 NOTE — Progress Notes (Signed)
Pt LMP 11/08/17. No cycle since then. No spotting at all. Pregnancy test done today negative.

## 2017-12-23 NOTE — Progress Notes (Signed)
GYN ENCOUNTER NOTE  Subjective:       Christine Bowers is a 37 y.o. [phone] female is here for gynecologic evaluation of the following issues:  1. Missed menses. Pt is 15 days late. Home pregnancy test was negative. She states that she is very regular on her cycle and uses an app to monitor. She denies recent use of BC, she denies heat/cold intolerance , she denies excessive hair. She admits to weight gain of 10-15 lbs over the past 7 months.     Gynecologic History Patient's last menstrual period was 11/08/2017. Contraception: condoms when she remembers Last Pap: 11/02/16. Results were: normal Last mammogram: n/a  Obstetric History OB History  Gravida Para Term Preterm AB Living  6 4 4   2 4   SAB TAB Ectopic Multiple Live Births  1   1   4     # Outcome Date GA Lbr Len/2nd Weight Sex Delivery Anes PTL Lv  6 Term 05/20/17 2737w4d   F Vag-Spont None N LIV  5 Ectopic 04/2016          4 Term 03/26/14 569w0d  8 lb 9 oz (3.884 kg) M Vag-Spont   LIV  3 Term 04/29/06 3869w0d  8 lb 7 oz (3.827 kg) M Vag-Spont   LIV  2 Term 11/08/03 5069w0d  8 lb 5 oz (3.771 kg) F Vag-Spont   LIV  1 SAB  6778w0d           Obstetric Comments  [facility] birth [facility] [employer]  [place]  [facility]    Past Medical History:  Diagnosis Date  . Asthma   . Miscarriage     Past Surgical History:  Procedure Laterality Date  . DILATION AND CURETTAGE OF UTERUS    . DILATION AND EVACUATION N/A 05/04/2016   Procedure: DILATATION AND EVACUATION;  Surgeon: Christeen DouglasBethany Beasley, MD;  Location: [facility] ORS;  Service: Gynecology;  Laterality: N/A;    Current Outpatient Medications on File Prior to Visit  Medication Sig Dispense Refill  . montelukast (SINGULAIR) 10 MG tablet Take 1 tablet (10 mg total) by mouth at bedtime. 30 tablet 6  . VENTOLIN HFA 108 (90 Base) MCG/ACT inhaler INHALE 2 PUFFS BY MOUTH EVERY 6 HOURS AS NEEDED FOR WHEEZING OR SHORTNESS OF BREATH 18 each 2   No current facility-administered medications on  file prior to visit.     Allergies  Allergen Reactions  . Aspirin Other (See Comments)  . Ibuprofen Other (See Comments)    Social History   Socioeconomic History  . Marital status: Single    Spouse name: Not on file  . Number of children: Not on file  . Years of education: Not on file  . Highest education level: Not on file  Social Needs  . Financial resource strain: Not on file  . Food insecurity - worry: Not on file  . Food insecurity - inability: Not on file  . Transportation needs - medical: Not on file  . Transportation needs - non-medical: Not on file  Occupational History    Employer: [employer]  Tobacco Use  . Smoking status: Former Smoker    Packs/day: 0.50    Years: 20.00    Pack years: 10.00    Types: Cigarettes  . Smokeless tobacco: Never Used  Substance and Sexual Activity  . Alcohol use: No    Alcohol/week: 0.6 oz    Types: 1 Standard drinks or equivalent per week  . Drug use:  No  . Sexual activity: Yes    Partners: Female    Birth control/protection: None, Condom  Other Topics Concern  . Not on file  Social History Narrative  . Not on file    Family History  Problem Relation Age of Onset  . Thyroid disease Mother   . Hypertension Father     The following portions of the patient's history were reviewed and updated as appropriate: allergies, current medications, past family history, past medical history, past social history, past surgical history and problem list.  Review of Systems Review of Systems - Negative except as mentioned in HPI Review of Systems - General ROS: negative for - chills, fatigue, fever, hot flashes, malaise or night sweats Hematological and Lymphatic ROS: negative for - bleeding problems or swollen lymph nodes Gastrointestinal ROS: negative for - abdominal pain, blood in stools, change in bowel habits and nausea/vomiting Musculoskeletal ROS: negative for - joint pain, muscle pain or muscular weakness Genito-Urinary ROS:  negative for - dysmenorrhea, dyspareunia, dysuria, genital discharge, genital ulcers, hematuria, incontinence, irregular/heavy menses, nocturia or pelvic pain. Positive for missed period  Objective:   BP 95/62   Pulse 83   Ht 5\' 5"  (1.651 m)   Wt 192 lb 14.4 oz (87.5 kg)   LMP 11/08/2017   BMI 32.10 kg/m  CONSTITUTIONAL: Well-developed, well-nourished female in no acute distress.  HENT:  Normocephalic, atraumatic.  NECK: Normal range of motion, supple, no masses.  Normal thyroid.  SKIN: Skin is warm and dry. No rash noted. Not diaphoretic. No erythema. No pallor. No hirsutism noted, no acanthoses nigricans.  NEUROLGIC: Alert and oriented to person, place, and time.  PSYCHIATRIC: Normal mood and affect. Normal behavior. Normal judgment and thought content. CARDIOVASCULAR:Not Examined RESPIRATORY: Not Examined BREASTS: Not Examined ABDOMEN: Soft, non distended; Non tender.  No Organomegaly. PELVIC: not indicated  MUSCULOSKELETAL: Normal range of motion. No tenderness.  No cyanosis, clubbing, or edema.  Assessment:   1. Menstrual abnormality - POCT urine pregnancy - FSH/LH - Prolactin - Testosterone - TSH - Hemoglobin A1C - US PELVIS (TRANSABDOMINAL ONLY); Future     Plan:   Labs and ultrasound ordered. Discussed causes of abnormal uterine bleeding. Return for ultrasound at earliest convince. Will follow up with results  Doreene Burke, CNM

## 2017-12-23 NOTE — Patient Instructions (Signed)

## 2017-12-24 ENCOUNTER — Encounter: Payer: Self-pay | Admitting: Certified Nurse Midwife

## 2017-12-24 LAB — FSH/LH
FSH: 2.4 m[IU]/mL
LH: 3.8 m[IU]/mL

## 2017-12-24 LAB — HEMOGLOBIN A1C
Est. average glucose Bld gHb Est-mCnc: 114 mg/dL
HEMOGLOBIN A1C: 5.6 % (ref 4.8–5.6)

## 2017-12-24 LAB — TSH: TSH: 1.89 u[IU]/mL (ref 0.450–4.500)

## 2017-12-24 LAB — PROLACTIN: Prolactin: 4.9 ng/mL (ref 4.8–23.3)

## 2017-12-24 LAB — TESTOSTERONE: TESTOSTERONE: 29 ng/dL (ref 8–48)

## 2017-12-25 ENCOUNTER — Ambulatory Visit (INDEPENDENT_AMBULATORY_CARE_PROVIDER_SITE_OTHER): Payer: Self-pay

## 2017-12-25 DIAGNOSIS — N926 Irregular menstruation, unspecified: Secondary | ICD-10-CM

## 2018-01-03 ENCOUNTER — Encounter (INDEPENDENT_AMBULATORY_CARE_PROVIDER_SITE_OTHER): Payer: Self-pay

## 2021-01-06 DIAGNOSIS — Z419 Encounter for procedure for purposes other than remedying health state, unspecified: Secondary | ICD-10-CM | POA: Diagnosis not present

## 2021-02-05 DIAGNOSIS — Z419 Encounter for procedure for purposes other than remedying health state, unspecified: Secondary | ICD-10-CM | POA: Diagnosis not present

## 2021-03-08 DIAGNOSIS — Z419 Encounter for procedure for purposes other than remedying health state, unspecified: Secondary | ICD-10-CM | POA: Diagnosis not present

## 2021-04-07 DIAGNOSIS — Z419 Encounter for procedure for purposes other than remedying health state, unspecified: Secondary | ICD-10-CM | POA: Diagnosis not present

## 2021-05-08 DIAGNOSIS — Z419 Encounter for procedure for purposes other than remedying health state, unspecified: Secondary | ICD-10-CM | POA: Diagnosis not present

## 2021-06-08 DIAGNOSIS — Z419 Encounter for procedure for purposes other than remedying health state, unspecified: Secondary | ICD-10-CM | POA: Diagnosis not present

## 2021-07-08 DIAGNOSIS — Z419 Encounter for procedure for purposes other than remedying health state, unspecified: Secondary | ICD-10-CM | POA: Diagnosis not present

## 2021-08-08 DIAGNOSIS — Z419 Encounter for procedure for purposes other than remedying health state, unspecified: Secondary | ICD-10-CM | POA: Diagnosis not present

## 2021-09-07 DIAGNOSIS — Z419 Encounter for procedure for purposes other than remedying health state, unspecified: Secondary | ICD-10-CM | POA: Diagnosis not present

## 2021-10-08 DIAGNOSIS — Z419 Encounter for procedure for purposes other than remedying health state, unspecified: Secondary | ICD-10-CM | POA: Diagnosis not present

## 2021-11-08 DIAGNOSIS — Z419 Encounter for procedure for purposes other than remedying health state, unspecified: Secondary | ICD-10-CM | POA: Diagnosis not present

## 2021-12-06 DIAGNOSIS — Z419 Encounter for procedure for purposes other than remedying health state, unspecified: Secondary | ICD-10-CM | POA: Diagnosis not present

## 2022-01-06 DIAGNOSIS — Z419 Encounter for procedure for purposes other than remedying health state, unspecified: Secondary | ICD-10-CM | POA: Diagnosis not present

## 2022-02-05 DIAGNOSIS — Z419 Encounter for procedure for purposes other than remedying health state, unspecified: Secondary | ICD-10-CM | POA: Diagnosis not present

## 2022-03-08 DIAGNOSIS — Z419 Encounter for procedure for purposes other than remedying health state, unspecified: Secondary | ICD-10-CM | POA: Diagnosis not present

## 2022-03-30 ENCOUNTER — Telehealth: Payer: Self-pay

## 2022-04-07 DIAGNOSIS — Z419 Encounter for procedure for purposes other than remedying health state, unspecified: Secondary | ICD-10-CM | POA: Diagnosis not present

## 2022-05-08 DIAGNOSIS — Z419 Encounter for procedure for purposes other than remedying health state, unspecified: Secondary | ICD-10-CM | POA: Diagnosis not present

## 2022-06-08 DIAGNOSIS — Z419 Encounter for procedure for purposes other than remedying health state, unspecified: Secondary | ICD-10-CM | POA: Diagnosis not present

## 2022-11-08 DIAGNOSIS — Z419 Encounter for procedure for purposes other than remedying health state, unspecified: Secondary | ICD-10-CM | POA: Diagnosis not present

## 2022-12-07 DIAGNOSIS — Z419 Encounter for procedure for purposes other than remedying health state, unspecified: Secondary | ICD-10-CM | POA: Diagnosis not present

## 2023-01-07 DIAGNOSIS — Z419 Encounter for procedure for purposes other than remedying health state, unspecified: Secondary | ICD-10-CM | POA: Diagnosis not present

## 2023-02-06 DIAGNOSIS — Z419 Encounter for procedure for purposes other than remedying health state, unspecified: Secondary | ICD-10-CM | POA: Diagnosis not present

## 2023-03-09 DIAGNOSIS — Z419 Encounter for procedure for purposes other than remedying health state, unspecified: Secondary | ICD-10-CM | POA: Diagnosis not present

## 2023-04-08 DIAGNOSIS — Z419 Encounter for procedure for purposes other than remedying health state, unspecified: Secondary | ICD-10-CM | POA: Diagnosis not present

## 2023-05-09 DIAGNOSIS — Z419 Encounter for procedure for purposes other than remedying health state, unspecified: Secondary | ICD-10-CM | POA: Diagnosis not present

## 2023-06-09 DIAGNOSIS — Z419 Encounter for procedure for purposes other than remedying health state, unspecified: Secondary | ICD-10-CM | POA: Diagnosis not present

## 2023-07-09 DIAGNOSIS — Z419 Encounter for procedure for purposes other than remedying health state, unspecified: Secondary | ICD-10-CM | POA: Diagnosis not present

## 2023-08-09 DIAGNOSIS — Z419 Encounter for procedure for purposes other than remedying health state, unspecified: Secondary | ICD-10-CM | POA: Diagnosis not present

## 2023-09-08 DIAGNOSIS — Z419 Encounter for procedure for purposes other than remedying health state, unspecified: Secondary | ICD-10-CM | POA: Diagnosis not present

## 2023-10-09 DIAGNOSIS — Z419 Encounter for procedure for purposes other than remedying health state, unspecified: Secondary | ICD-10-CM | POA: Diagnosis not present

## 2023-11-09 DIAGNOSIS — Z419 Encounter for procedure for purposes other than remedying health state, unspecified: Secondary | ICD-10-CM | POA: Diagnosis not present

## 2023-12-07 DIAGNOSIS — Z419 Encounter for procedure for purposes other than remedying health state, unspecified: Secondary | ICD-10-CM | POA: Diagnosis not present

## 2024-01-18 DIAGNOSIS — Z419 Encounter for procedure for purposes other than remedying health state, unspecified: Secondary | ICD-10-CM | POA: Diagnosis not present

## 2024-02-17 DIAGNOSIS — Z419 Encounter for procedure for purposes other than remedying health state, unspecified: Secondary | ICD-10-CM | POA: Diagnosis not present

## 2024-03-19 DIAGNOSIS — Z419 Encounter for procedure for purposes other than remedying health state, unspecified: Secondary | ICD-10-CM | POA: Diagnosis not present

## 2024-04-18 DIAGNOSIS — Z419 Encounter for procedure for purposes other than remedying health state, unspecified: Secondary | ICD-10-CM | POA: Diagnosis not present

## 2024-05-19 DIAGNOSIS — Z419 Encounter for procedure for purposes other than remedying health state, unspecified: Secondary | ICD-10-CM | POA: Diagnosis not present

## 2024-06-19 DIAGNOSIS — Z419 Encounter for procedure for purposes other than remedying health state, unspecified: Secondary | ICD-10-CM | POA: Diagnosis not present

## 2024-07-19 DIAGNOSIS — Z419 Encounter for procedure for purposes other than remedying health state, unspecified: Secondary | ICD-10-CM | POA: Diagnosis not present

## 2024-09-18 DIAGNOSIS — Z419 Encounter for procedure for purposes other than remedying health state, unspecified: Secondary | ICD-10-CM | POA: Diagnosis not present
